# Patient Record
Sex: Male | Born: 1975 | Race: Black or African American | Hispanic: No | Marital: Married | Smoking: Never smoker
Health system: Southern US, Community
[De-identification: ages and names within clinical notes are randomized; demographics above are authoritative.]

## PROBLEM LIST (undated history)

## (undated) DIAGNOSIS — E119 Type 2 diabetes mellitus without complications: Secondary | ICD-10-CM

## (undated) DIAGNOSIS — E785 Hyperlipidemia, unspecified: Secondary | ICD-10-CM

## (undated) HISTORY — DX: Hyperlipidemia, unspecified: E78.5

## (undated) HISTORY — DX: Type 2 diabetes mellitus without complications: E11.9

---

## 2000-04-18 ENCOUNTER — Emergency Department (HOSPITAL_COMMUNITY): Admission: EM | Admit: 2000-04-18 | Discharge: 2000-04-18 | Payer: Self-pay | Admitting: Emergency Medicine

## 2000-04-18 ENCOUNTER — Encounter: Payer: Self-pay | Admitting: Emergency Medicine

## 2001-05-30 ENCOUNTER — Encounter: Payer: Self-pay | Admitting: Emergency Medicine

## 2001-05-30 ENCOUNTER — Emergency Department (HOSPITAL_COMMUNITY): Admission: EM | Admit: 2001-05-30 | Discharge: 2001-05-31 | Payer: Self-pay | Admitting: Emergency Medicine

## 2001-05-31 ENCOUNTER — Encounter: Payer: Self-pay | Admitting: Emergency Medicine

## 2002-11-01 ENCOUNTER — Emergency Department (HOSPITAL_COMMUNITY): Admission: EM | Admit: 2002-11-01 | Discharge: 2002-11-01 | Payer: Self-pay | Admitting: Emergency Medicine

## 2002-11-01 ENCOUNTER — Encounter: Payer: Self-pay | Admitting: Emergency Medicine

## 2003-04-25 ENCOUNTER — Emergency Department (HOSPITAL_COMMUNITY): Admission: EM | Admit: 2003-04-25 | Discharge: 2003-04-25 | Payer: Self-pay

## 2003-04-25 ENCOUNTER — Encounter: Payer: Self-pay | Admitting: Emergency Medicine

## 2003-08-03 ENCOUNTER — Emergency Department (HOSPITAL_COMMUNITY): Admission: EM | Admit: 2003-08-03 | Discharge: 2003-08-03 | Payer: Self-pay | Admitting: Emergency Medicine

## 2003-08-04 ENCOUNTER — Emergency Department (HOSPITAL_COMMUNITY): Admission: AD | Admit: 2003-08-04 | Discharge: 2003-08-04 | Payer: Self-pay | Admitting: Family Medicine

## 2003-08-06 ENCOUNTER — Emergency Department (HOSPITAL_COMMUNITY): Admission: AD | Admit: 2003-08-06 | Discharge: 2003-08-06 | Payer: Self-pay | Admitting: Emergency Medicine

## 2003-08-12 ENCOUNTER — Emergency Department (HOSPITAL_COMMUNITY): Admission: AD | Admit: 2003-08-12 | Discharge: 2003-08-12 | Payer: Self-pay | Admitting: Family Medicine

## 2003-09-17 ENCOUNTER — Emergency Department (HOSPITAL_COMMUNITY): Admission: AD | Admit: 2003-09-17 | Discharge: 2003-09-17 | Payer: Self-pay | Admitting: Family Medicine

## 2003-11-29 ENCOUNTER — Emergency Department (HOSPITAL_COMMUNITY): Admission: EM | Admit: 2003-11-29 | Discharge: 2003-11-29 | Payer: Self-pay | Admitting: Emergency Medicine

## 2004-03-02 ENCOUNTER — Emergency Department (HOSPITAL_COMMUNITY): Admission: EM | Admit: 2004-03-02 | Discharge: 2004-03-02 | Payer: Self-pay | Admitting: Emergency Medicine

## 2004-11-05 ENCOUNTER — Emergency Department (HOSPITAL_COMMUNITY): Admission: EM | Admit: 2004-11-05 | Discharge: 2004-11-05 | Payer: Self-pay | Admitting: Emergency Medicine
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
## 2005-05-20 ENCOUNTER — Emergency Department (HOSPITAL_COMMUNITY): Admission: EM | Admit: 2005-05-20 | Discharge: 2005-05-20 | Payer: Self-pay | Admitting: Emergency Medicine

## 2006-12-25 ENCOUNTER — Emergency Department (HOSPITAL_COMMUNITY): Admission: EM | Admit: 2006-12-25 | Discharge: 2006-12-25 | Payer: Self-pay | Admitting: Emergency Medicine

## 2007-03-02 ENCOUNTER — Emergency Department (HOSPITAL_COMMUNITY): Admission: EM | Admit: 2007-03-02 | Discharge: 2007-03-02 | Payer: Self-pay | Admitting: Family Medicine

## 2012-04-07 ENCOUNTER — Encounter (HOSPITAL_BASED_OUTPATIENT_CLINIC_OR_DEPARTMENT_OTHER): Payer: Self-pay | Admitting: Emergency Medicine

## 2012-04-07 ENCOUNTER — Emergency Department (HOSPITAL_BASED_OUTPATIENT_CLINIC_OR_DEPARTMENT_OTHER)
Admission: EM | Admit: 2012-04-07 | Discharge: 2012-04-07 | Disposition: A | Discharge status: Home / Self Care | Payer: 59 | Attending: Emergency Medicine | Admitting: Emergency Medicine

## 2012-04-07 DIAGNOSIS — J069 Acute upper respiratory infection, unspecified: Secondary | ICD-10-CM | POA: Insufficient documentation

## 2012-04-07 DIAGNOSIS — R040 Epistaxis: Secondary | ICD-10-CM

## 2012-04-07 NOTE — ED Provider Notes (Signed)
History     CSN: [id number]  Arrival date & time 04/07/12  0005   First MD Initiated Contact with Patient 04/07/12 0114      Chief Complaint  Patient presents with  . Nasal Congestion    (Consider location/radiation/quality/duration/timing/severity/associated sxs/prior treatment) Patient is a 36 y.o. male presenting with nosebleeds. The history is provided by the patient. No language interpreter was used.  Epistaxis  This is a new problem. The current episode started 1 to 2 hours ago. The problem occurs constantly. The problem has been resolved. Associated with: none. The bleeding has been from the right nare. He has tried applying pressure for the symptoms. The treatment provided significant relief. His past medical history is significant for colds. His past medical history does not include bleeding disorder, sinus problems, allergies, nose-picking or frequent nosebleeds.  Has had URI and right nostril bled for seconds and wife wanted him check out so he came to the ED.  No f/c/r.  No node swelling no easy bruising or bleeding  History reviewed. No pertinent past medical history.  No past surgical history on file.  No family history on file.  History  Substance Use Topics  . Smoking status: Never Smoker   . Smokeless tobacco: Not on file  . Alcohol Use: No      Review of Systems  Constitutional: Negative for fever.  HENT: Positive for nosebleeds, congestion and rhinorrhea. Negative for neck pain and neck stiffness.   Respiratory: Negative for shortness of breath.   Cardiovascular: Negative for chest pain.  Hematological: Does not bruise/bleed easily.  All other systems reviewed and are negative.    Allergies  Review of patient's allergies indicates no known allergies.  Home Medications  No current outpatient prescriptions on file.  BP 147/94  Pulse 90  Temp 97.9 F (36.6 C) (Oral)  Resp 18  Ht 6' 0.5" (1.842 m)  Wt 205 lb (92.987 kg)  BMI 27.42 kg/m2  SpO2  100%  Physical Exam  Constitutional: He is oriented to person, place, and time. He appears well-developed and well-nourished. No distress.  HENT:  Head: Normocephalic and atraumatic.  Nose: No nasal deformity, septal deviation or nasal septal hematoma. No epistaxis. Right sinus exhibits no maxillary sinus tenderness and no frontal sinus tenderness. Left sinus exhibits no maxillary sinus tenderness and no frontal sinus tenderness.  Mouth/Throat: Oropharynx is clear and moist.       Redness at right kisselbachs plexus no bleeding  Eyes: Conjunctivae are normal. Pupils are equal, round, and reactive to light.  Neck: Normal range of motion. Neck supple.  Cardiovascular: Normal rate and regular rhythm.   Pulmonary/Chest: Effort normal and breath sounds normal. No respiratory distress. He has no wheezes. He has no rales.  Abdominal: Soft. Bowel sounds are normal. There is no tenderness.  Musculoskeletal: Normal range of motion.  Lymphadenopathy:    He has no cervical adenopathy.  Neurological: He is oriented to person, place, and time.  Skin: Skin is warm and dry.  Psychiatric: He has a normal mood and affect.    ED Course  Procedures (including critical care time)  Labs Reviewed - No data to display No results found.   No diagnosis found.    MDM  URI with brief epistaxis saline spray.  Follow up with PMD return for any concerning symptoms.  Patient verbalizes understanding and agrees to follow up        Akasha Melena Smitty Cords, MD 04/07/12 [phone]

## 2012-04-07 NOTE — ED Notes (Signed)
States other membersof family have been sick with cold and now he has been congested several days,but tonight blew his nose and had a nose bleed which stopped quickly

## 2013-08-12 ENCOUNTER — Encounter (HOSPITAL_BASED_OUTPATIENT_CLINIC_OR_DEPARTMENT_OTHER): Payer: Self-pay | Admitting: Emergency Medicine

## 2013-08-12 ENCOUNTER — Emergency Department (HOSPITAL_BASED_OUTPATIENT_CLINIC_OR_DEPARTMENT_OTHER)
Admission: EM | Admit: 2013-08-12 | Discharge: 2013-08-13 | Disposition: A | Discharge status: Home / Self Care | Payer: BC Managed Care – PPO | Attending: Emergency Medicine | Admitting: Emergency Medicine

## 2013-08-12 ENCOUNTER — Emergency Department (HOSPITAL_BASED_OUTPATIENT_CLINIC_OR_DEPARTMENT_OTHER): Payer: BC Managed Care – PPO

## 2013-08-12 DIAGNOSIS — R079 Chest pain, unspecified: Secondary | ICD-10-CM

## 2013-08-12 DIAGNOSIS — L723 Sebaceous cyst: Secondary | ICD-10-CM | POA: Insufficient documentation

## 2013-08-12 DIAGNOSIS — R072 Precordial pain: Secondary | ICD-10-CM | POA: Insufficient documentation

## 2013-08-12 LAB — BASIC METABOLIC PANEL
Calcium: 9.9 mg/dL (ref 8.4–10.5)
Creatinine, Ser: 1 mg/dL (ref 0.50–1.35)
Potassium: 4.1 mEq/L (ref 3.5–5.1)

## 2013-08-12 LAB — CBC WITH DIFFERENTIAL/PLATELET
HCT: 47.9 % (ref 39.0–52.0)
Lymphs Abs: 2.2 10*3/uL (ref 0.7–4.0)
MCH: 28.6 pg (ref 26.0–34.0)
MCV: 85.2 fL (ref 78.0–100.0)
Monocytes Absolute: 0.8 10*3/uL (ref 0.1–1.0)
Monocytes Relative: 10 % (ref 3–12)
Neutrophils Relative %: 62 % (ref 43–77)
Platelets: 270 10*3/uL (ref 150–400)
RBC: 5.62 MIL/uL (ref 4.22–5.81)

## 2013-08-12 LAB — D-DIMER, QUANTITATIVE: D-Dimer, Quant: <0.27 ug/mL-FEU (ref 0.00–0.48)

## 2013-08-12 LAB — TROPONIN I: Troponin I: <0.3 ng/mL (ref <0.30)

## 2013-08-12 MED ORDER — DOXYCYCLINE HYCLATE 100 MG PO CAPS: 100.0000 mg | ORAL_CAPSULE | Freq: Two times a day (BID) | ORAL | Status: DC

## 2013-08-12 MED ORDER — HYDROCODONE-ACETAMINOPHEN 5-325 MG PO TABS: 1.0000 | ORAL_TABLET | Freq: Four times a day (QID) | ORAL | Status: DC | PRN

## 2013-08-12 MED ORDER — ASPIRIN 81 MG PO CHEW
324.0000 mg | CHEWABLE_TABLET | Freq: Once | ORAL | Status: AC
  Administered 2013-08-12: 324 mg via ORAL
  Filled 2013-08-12: qty 4

## 2013-08-12 MED ORDER — NAPROXEN 500 MG PO TABS: 500.0000 mg | ORAL_TABLET | Freq: Two times a day (BID) | ORAL | Status: DC

## 2013-08-12 MED ORDER — HYDROCODONE-ACETAMINOPHEN 5-325 MG PO TABS
1.0000 | ORAL_TABLET | Freq: Once | ORAL | Status: AC
  Administered 2013-08-12: 1 via ORAL
  Filled 2013-08-12: qty 1

## 2013-08-12 NOTE — ED Provider Notes (Signed)
CSN: [id number]     Arrival date & time 08/12/13  1937 History  This chart was scribed for Shelda Jakes, MD by Ronal Fear, ED Scribe. This patient was seen in room MH10/MH10 and the patient's care was started at 9:20 PM.     Chief Complaint  Patient presents with  . Chest Pain   (Consider location/radiation/quality/duration/timing/severity/associated sxs/prior Treatment) Patient is a 37 y.o. male presenting with chest pain. The history is provided by the patient. No language interpreter was used.  Chest Pain Pain location:  Substernal area Pain quality: sharp and throbbing   Pain radiates to:  Does not radiate Pain radiates to the back: no   Pain severity:  Mild Onset quality:  Sudden Duration:  2 days Timing:  Constant Progression:  Unchanged Chronicity:  New Context: not at rest   Relieved by:  None tried Worsened by:  Nothing tried Associated symptoms: cough   Associated symptoms: no abdominal pain, no back pain, no dizziness, no fever, no headache, no lower extremity edema, no nausea, no shortness of breath and not vomiting     HPI Comments: James Hudson is a 37 y.o. male who presents to the Emergency Department complaining of sudden onset constant sharp throbbing 8/10 substernal chest pain that does not radiate to his back. Pt has not experienced any prior occurences. He states that nothing makes the pain better or worse. Pt also complains of a cyst in his right axilla. Pt denies SOB.   History reviewed. No pertinent past medical history. History reviewed. No pertinent past surgical history. No family history on file. History  Substance Use Topics  . Smoking status: Never Smoker   . Smokeless tobacco: Not on file  . Alcohol Use: No    Review of Systems  Constitutional: Negative for fever and chills.  HENT: Negative for congestion, rhinorrhea and sore throat.   Eyes: Negative for visual disturbance.  Respiratory: Positive for cough. Negative for shortness  of breath.   Cardiovascular: Positive for chest pain. Negative for leg swelling.  Gastrointestinal: Negative for nausea, vomiting, abdominal pain and diarrhea.  Genitourinary: Negative for dysuria.  Musculoskeletal: Negative for back pain, joint swelling and neck pain.  Skin: Negative for rash.  Neurological: Negative for dizziness and headaches.  Hematological: Does not bruise/bleed easily.  All other systems reviewed and are negative.    Allergies  Review of patient's allergies indicates no known allergies.  Home Medications   Current Outpatient Rx  Name  Route  Sig  Dispense  Refill  . doxycycline (VIBRAMYCIN) 100 MG capsule   Oral   Take 1 capsule (100 mg total) by mouth 2 (two) times daily.   14 capsule   0   . HYDROcodone-acetaminophen (NORCO/VICODIN) 5-325 MG per tablet   Oral   Take 1-2 tablets by mouth every 6 (six) hours as needed for moderate pain.   14 tablet   0   . naproxen (NAPROSYN) 500 MG tablet   Oral   Take 1 tablet (500 mg total) by mouth 2 (two) times daily.   14 tablet   0    BP 122/76  Pulse 110  Temp(Src) 98.6 F (37 C) (Oral)  Resp 18  Ht 6\' 1"  (1.854 m)  Wt 265 lb (120.203 kg)  BMI 34.97 kg/m2  SpO2 98% Physical Exam  Nursing note and vitals reviewed. Constitutional: He is oriented to person, place, and time. He appears well-developed and well-nourished.  HENT:  Head: Normocephalic and atraumatic.  Mouth/Throat: No  oropharyngeal exudate.  Eyes: Conjunctivae are normal. Pupils are equal, round, and reactive to light.  Neck: Neck supple.  Cardiovascular: Normal rate, regular rhythm and normal heart sounds.   No murmur heard. Pulmonary/Chest: Effort normal and breath sounds normal. No respiratory distress. He has no wheezes.  Abdominal: Soft. Bowel sounds are normal. There is no tenderness.  Musculoskeletal: He exhibits no edema.  Left axilla 3cm area of induration, tenderness, minimal redness, no fluctuance, no adenopathy   Lymphadenopathy:    He has no cervical adenopathy.  Neurological: He is alert and oriented to person, place, and time. No cranial nerve deficit. He exhibits normal muscle tone. Coordination normal.  Skin: Skin is warm and dry.  Psychiatric: He has a normal mood and affect.    ED Course  Procedures (including critical care time)  9:28 PM- Pt advised of plan for treatment including pain medication and chest x-ray and pt agrees.  Labs Review Labs Reviewed  BASIC METABOLIC PANEL - Abnormal; Notable for the following:    Glucose, Bld 112 (*)    All other components within normal limits  CBC WITH DIFFERENTIAL  D-DIMER, QUANTITATIVE  TROPONIN I   Imaging Review Dg Chest 2 View  08/12/2013   CLINICAL DATA:  37 year old male with right chest pain  EXAM: CHEST  2 VIEW  COMPARISON:  11/05/2004 chest radiograph.  FINDINGS: The cardiomediastinal silhouette is unremarkable.  There is no evidence of focal airspace disease, pulmonary edema, suspicious pulmonary nodule/mass, pleural effusion, or pneumothorax. No acute bony abnormalities are identified.  IMPRESSION: No active cardiopulmonary disease.   Electronically Signed   By: Laveda Abbe M.D.   On: 08/12/2013 21:55    EKG Interpretation     Ventricular Rate:  107 PR Interval:  168 QRS Duration: 88 QT Interval:  326 QTC Calculation: 435 R Axis:   -43 Text Interpretation:  Sinus tachycardia Possible Left atrial enlargement Left axis deviation Anterior infarct , age undetermined Abnormal ECG           Results for orders placed during the hospital encounter of 08/12/13  CBC WITH DIFFERENTIAL      Result Value Range   WBC 8.0  4.0 - 10.5 K/uL   RBC 5.62  4.22 - 5.81 MIL/uL   Hemoglobin 16.1  13.0 - 17.0 g/dL   HCT 16.1  09.6 - 04.5 %   MCV 85.2  78.0 - 100.0 fL   MCH 28.6  26.0 - 34.0 pg   MCHC 33.6  30.0 - 36.0 g/dL   RDW 40.9  81.1 - 91.4 %   Platelets 270  150 - 400 K/uL   Neutrophils Relative % 62  43 - 77 %   Neutro Abs 5.0   1.7 - 7.7 K/uL   Lymphocytes Relative 27  12 - 46 %   Lymphs Abs 2.2  0.7 - 4.0 K/uL   Monocytes Relative 10  3 - 12 %   Monocytes Absolute 0.8  0.1 - 1.0 K/uL   Eosinophils Relative 1  0 - 5 %   Eosinophils Absolute 0.1  0.0 - 0.7 K/uL   Basophils Relative 0  0 - 1 %   Basophils Absolute 0.0  0.0 - 0.1 K/uL  BASIC METABOLIC PANEL      Result Value Range   Sodium 138  135 - 145 mEq/L   Potassium 4.1  3.5 - 5.1 mEq/L   Chloride 98  96 - 112 mEq/L   CO2 29  19 - 32 mEq/L  Glucose, Bld 112 (*) 70 - 99 mg/dL   BUN 14  6 - 23 mg/dL   Creatinine, Ser 1.61  0.50 - 1.35 mg/dL   Calcium 9.9  8.4 - 09.6 mg/dL   GFR calc non Af Amer >90  >90 mL/min   GFR calc Af Amer >90  >90 mL/min  D-DIMER, QUANTITATIVE      Result Value Range   D-Dimer, Quant <0.27  0.00 - 0.48 ug/mL-FEU  TROPONIN I      Result Value Range   Troponin I <0.30  <0.30 ng/mL     MDM   1. Chest pain   2. Sebaceous cyst of right axilla    Patient's chest pain workup without any sniffing findings. D-dimer negative not consistent with pulmonary embolism. Patient's troponin is negative after 2 days of chest pain. Chest pains been constant. Electrolytes without sniffing abnormalities no renal dysfunction. No leukocytosis no anemia. EKG had no acute changes. Chest x-rays negative for pneumonia pneumothorax or pulmonary edema. Etiology of chest pain seems to be noncardiac.  Currently patient's son nontoxic no acute distress.  In addition patient had the soreness and tenderness to the right axillary area with a 3 cm area of induration no fluctuance developing abscess persist. Will treat with doxycycline not ready for incision and drainage at this time. Patient will watch this carefully if he gets worse it may develop into an abscess that needs to be I&D.  I personally performed the services described in this documentation, which was scribed in my presence. The recorded information has been reviewed and is  accurate.     Shelda Jakes, MD 08/12/13 [phone]

## 2013-08-12 NOTE — ED Notes (Signed)
Patient transported to X-ray 

## 2013-08-12 NOTE — ED Notes (Signed)
Sharp central CP x 2 days-"knot came up under my arm" x 2 days

## 2013-08-12 NOTE — ED Notes (Signed)
Chest pain x 2 days, mid sternal,  No n/v,    States has knot under rt arm that started at same time chest pain

## 2013-08-12 NOTE — ED Notes (Signed)
MD at bedside giving verbal discharge instructions.

## 2013-08-12 NOTE — ED Notes (Signed)
EKG was reviewed by EDP Zacowski-no orders received

## 2013-08-20 ENCOUNTER — Ambulatory Visit: Payer: Self-pay | Admitting: Podiatrist

## 2013-09-10 ENCOUNTER — Ambulatory Visit: Payer: Self-pay | Admitting: Podiatrist

## 2013-10-16 ENCOUNTER — Encounter (HOSPITAL_BASED_OUTPATIENT_CLINIC_OR_DEPARTMENT_OTHER): Payer: Self-pay | Admitting: Emergency Medicine

## 2013-10-16 ENCOUNTER — Emergency Department (HOSPITAL_BASED_OUTPATIENT_CLINIC_OR_DEPARTMENT_OTHER)
Admission: EM | Admit: 2013-10-16 | Discharge: 2013-10-16 | Disposition: A | Discharge status: Home / Self Care | Payer: BC Managed Care – PPO | Attending: Emergency Medicine | Admitting: Emergency Medicine

## 2013-10-16 DIAGNOSIS — J069 Acute upper respiratory infection, unspecified: Secondary | ICD-10-CM | POA: Insufficient documentation

## 2013-10-16 MED ORDER — PROMETHAZINE-DM 6.25-15 MG/5ML PO SYRP: 2.5000 mL | ORAL_SOLUTION | Freq: Four times a day (QID) | ORAL | Status: DC | PRN

## 2013-10-16 NOTE — Discharge Instructions (Signed)
As discussed, you have been diagnosed with an acute upper respiratory infection.  Her vital signs, and physical exam are reassuring.  However, it is important that you monitor your condition carefully, and do not hesitate to return here if you develop new, or concerning changes in your condition.

## 2013-10-16 NOTE — ED Notes (Signed)
Patient here with bodyaches, cough and congestion x 4 days. No distress. Cough worse at night

## 2013-10-16 NOTE — ED Provider Notes (Signed)
CSN: [id number]     Arrival date & time 10/16/13  1054 History   First MD Initiated Contact with Patient 10/16/13 1133     Chief Complaint  Patient presents with  . Nasal Congestion  . Cough  . Generalized Body Aches   (Consider location/radiation/quality/duration/timing/severity/associated sxs/prior Treatment) HPI Patient presents with concerns of cough, congestion.  Symptoms began 4 days ago.  Since onset symptoms have been persistent.  There is mild relief with Nyquil, Mucinex. Patient describes warm and cold sensation, has no objective fever. Patient denies dyspnea, headache, and falling, confusion, dyspnea, vomiting, nausea, vomiting, diarrhea. Patient is a positive sick contacts, including his wife. No flu shot this year.  History reviewed. No pertinent past medical history. History reviewed. No pertinent past surgical history. No family history on file. History  Substance Use Topics  . Smoking status: Never Smoker   . Smokeless tobacco: Not on file  . Alcohol Use: No    Review of Systems  Constitutional:       Per HPI, otherwise negative  HENT:       Per HPI, otherwise negative  Respiratory:       Per HPI, otherwise negative  Cardiovascular:       Per HPI, otherwise negative  Gastrointestinal: Negative for vomiting.  Endocrine:       Negative aside from HPI  Genitourinary:       Neg aside from HPI   Musculoskeletal:       Per HPI, otherwise negative  Skin: Negative.   Neurological: Negative for syncope.    Allergies  Review of patient's allergies indicates no known allergies.  Home Medications   Current Outpatient Rx  Name  Route  Sig  Dispense  Refill  . promethazine-dextromethorphan (PROMETHAZINE-DM) 6.25-15 MG/5ML syrup   Oral   Take 2.5 mLs by mouth 4 (four) times daily as needed for cough.   118 mL   0    BP 134/84  Pulse 94  Temp(Src) 98.4 F (36.9 C) (Oral)  Resp 18  Ht 6\' 1"  (1.854 m)  Wt 275 lb (124.739 kg)  BMI 36.29 kg/m2  SpO2  100% Physical Exam  Nursing note and vitals reviewed. Constitutional: He is oriented to person, place, and time. He appears well-developed. No distress.  HENT:  Head: Normocephalic and atraumatic.  Eyes: Conjunctivae and EOM are normal.  Cardiovascular: Normal rate and regular rhythm.   Pulmonary/Chest: Effort normal. No stridor. No respiratory distress.  Abdominal: He exhibits no distension.  Musculoskeletal: He exhibits no edema.  Neurological: He is alert and oriented to person, place, and time.  Skin: Skin is warm and dry.  Psychiatric: He has a normal mood and affect.    ED Course  Procedures (including critical care time) Labs Review Labs Reviewed - No data to display Imaging Review No results found.  EKG Interpretation   None       MDM   1. URI, acute    This generally well male presents with cough, congestion.  On exam he is awake alert and afebrile.  Patient does not smoke, has low risk profile for occult infection.  Patient started on new symptomatic management medication, discharged in stable condition.    Gerhard Munchobert Effa Yarrow, MD 10/16/13 1159

## 2013-10-18 ENCOUNTER — Encounter (HOSPITAL_COMMUNITY): Payer: Self-pay | Admitting: Emergency Medicine

## 2013-10-18 ENCOUNTER — Emergency Department (HOSPITAL_COMMUNITY)
Admission: EM | Admit: 2013-10-18 | Discharge: 2013-10-19 | Disposition: A | Discharge status: Home / Self Care | Payer: BC Managed Care – PPO | Attending: Emergency Medicine | Admitting: Emergency Medicine

## 2013-10-18 DIAGNOSIS — H109 Unspecified conjunctivitis: Secondary | ICD-10-CM | POA: Insufficient documentation

## 2013-10-18 DIAGNOSIS — R040 Epistaxis: Secondary | ICD-10-CM | POA: Insufficient documentation

## 2013-10-18 DIAGNOSIS — R599 Enlarged lymph nodes, unspecified: Secondary | ICD-10-CM | POA: Insufficient documentation

## 2013-10-18 DIAGNOSIS — B9789 Other viral agents as the cause of diseases classified elsewhere: Secondary | ICD-10-CM

## 2013-10-18 DIAGNOSIS — J069 Acute upper respiratory infection, unspecified: Secondary | ICD-10-CM | POA: Insufficient documentation

## 2013-10-18 MED ORDER — SALINE SPRAY 0.65 % NA SOLN
1.0000 | Freq: Once | NASAL | Status: AC
  Administered 2013-10-19: 1 via NASAL
  Filled 2013-10-18: qty 44

## 2013-10-18 MED ORDER — ARTIFICIAL TEARS OP OINT
TOPICAL_OINTMENT | Freq: Once | OPHTHALMIC | Status: AC
  Administered 2013-10-19: via OPHTHALMIC
  Filled 2013-10-18: qty 3.5

## 2013-10-18 MED ORDER — IBUPROFEN 200 MG PO TABS
600.0000 mg | ORAL_TABLET | Freq: Once | ORAL | Status: AC
  Administered 2013-10-18: 600 mg via ORAL
  Filled 2013-10-18: qty 3

## 2013-10-18 NOTE — ED Notes (Signed)
Patient was states he was seen [facility] and treated for URI. Patient states they gave him cough medication which he started having irritation in his eyes. Patient states he feels a lot of pressure in his eye and it feels like something is in his eyes.

## 2013-10-18 NOTE — ED Notes (Signed)
Pt from home vis [facility] c/o of a nosebleed x 3 days along with body aches, cough and congestion. He is also c/o redness, itching to the eyes and states "feels like there is grit's in my eye". Eyes do appear red and watery.

## 2013-10-19 MED ORDER — IBUPROFEN 600 MG PO TABS: 600.0000 mg | ORAL_TABLET | Freq: Four times a day (QID) | ORAL | Status: DC | PRN

## 2013-10-19 MED ORDER — ERYTHROMYCIN 5 MG/GM OP OINT: TOPICAL_OINTMENT | OPHTHALMIC | Status: DC

## 2013-10-19 MED ORDER — OXYMETAZOLINE HCL 0.05 % NA SOLN: 1.0000 | Freq: Two times a day (BID) | NASAL | Status: DC

## 2013-10-19 NOTE — ED Provider Notes (Signed)
CSN: [id number]     Arrival date & time 10/18/13  2050 History   First MD Initiated Contact with Patient 10/18/13 2313     Chief Complaint  Patient presents with  . Epistaxis   (Consider location/radiation/quality/duration/timing/severity/associated sxs/prior Treatment) HPI Comments: Pt comes in with cc of nose bleed. No medical hx. 4 days of nasal congestion, sore throat, red eyes and tearing. Pt also has been having epistaxis x 3 days, intermittent. No cocaine use. No trauma. No n/v/f/c.   Patient is a 38 y.o. male presenting with nosebleeds. The history is provided by the patient.  Epistaxis Associated symptoms: cough   Associated symptoms: no fever     History reviewed. No pertinent past medical history. History reviewed. No pertinent past surgical history. History reviewed. No pertinent family history. History  Substance Use Topics  . Smoking status: Never Smoker   . Smokeless tobacco: Not on file  . Alcohol Use: No    Review of Systems  Constitutional: Positive for chills and fatigue. Negative for fever.  HENT: Positive for nosebleeds.   Respiratory: Positive for cough. Negative for shortness of breath.   Cardiovascular: Negative for chest pain.  Gastrointestinal: Negative for nausea and vomiting.  Skin: Negative for rash.  Hematological: Does not bruise/bleed easily.  Psychiatric/Behavioral: Negative for confusion.    Allergies  Review of patient's allergies indicates no known allergies.  Home Medications   Current Outpatient Rx  Name  Route  Sig  Dispense  Refill  . acetaminophen (TYLENOL) 325 MG tablet   Oral   Take 650 mg by mouth every 6 (six) hours as needed (pain).         Marland Kitchen guaiFENesin (MUCINEX) 600 MG 12 hr tablet   Oral   Take 1,200 mg by mouth 2 (two) times daily as needed (cough).         . promethazine-dextromethorphan (PROMETHAZINE-DM) 6.25-15 MG/5ML syrup   Oral   Take 2.5 mLs by mouth 4 (four) times daily as needed for cough.   118  mL   0   . erythromycin ophthalmic ointment      Place a 1/2 inch ribbon of ointment into the lower eyelid.   3.5 g   0   . ibuprofen (ADVIL,MOTRIN) 600 MG tablet   Oral   Take 1 tablet (600 mg total) by mouth every 6 (six) hours as needed.   30 tablet   0   . oxymetazoline (AFRIN NASAL SPRAY) 0.05 % nasal spray   Each Nare   Place 1 spray into both nostrils 2 (two) times daily.   30 mL   0    BP 130/84  Pulse 93  Temp(Src) 99 F (37.2 C) (Oral)  Resp 22  Ht 6\' 1"  (1.854 m)  Wt 275 lb (124.739 kg)  BMI 36.29 kg/m2  SpO2 99% Physical Exam  Nursing note and vitals reviewed. Constitutional: He is oriented to person, place, and time. He appears well-developed.  HENT:  Head: Normocephalic and atraumatic.  Eyes: Conjunctivae and EOM are normal. Pupils are equal, round, and reactive to light. No scleral icterus.  Bilateral eyes have injected sclerae, no purulence.  Neck: Normal range of motion. Neck supple.  Posterior pharynx erythema  Cardiovascular: Normal rate and regular rhythm.   Pulmonary/Chest: Effort normal and breath sounds normal. No respiratory distress. He has no wheezes. He has no rales. He exhibits no tenderness.  Abdominal: Soft. Bowel sounds are normal. He exhibits no distension. There is no tenderness. There is no rebound  and no guarding.  Lymphadenopathy:    He has cervical adenopathy.  Neurological: He is alert and oriented to person, place, and time.  Skin: Skin is warm.    ED Course  Procedures (including critical care time) Labs Review Labs Reviewed - No data to display Imaging Review No results found.  EKG Interpretation   None       MDM   1. Viral URI with cough   2. Conjunctivitis   3. Epistaxis    Pt comes in with URI like sx and epistaxis.  Pt's vitals are stable, and sx started 4 days ago. No clear evidence of nose bleed source on exam - some dry blood seen - but there is no active bleeding, or a scab. Pt will get epistaxis  meds. Artifical tears and topical eye tx IF NOT BETTER BY Thursday.  Derwood KaplanAnkit Yolonda Purtle, MD 10/19/13 [phone]

## 2013-10-19 NOTE — Discharge Instructions (Signed)
We think what you have is a viral syndrome - the treatment for which is symptomatic relief only, and your body will fight the infection off in a few days. We are prescribing you some meds for pain and fevers and eye complains and nos bleeding. See your primary care doctor in 1 week if the symptoms dont improve.   Upper Respiratory Infection, Adult An upper respiratory infection (URI) is also known as the common cold. It is often caused by a type of germ (virus). Colds are easily spread (contagious). You can pass it to others by kissing, coughing, sneezing, or drinking out of the same glass. Usually, you get better in 1 or 2 weeks.  HOME CARE   Only take medicine as told by your doctor.  Use a warm mist humidifier or breathe in steam from a hot shower.  Drink enough water and fluids to keep your pee (urine) clear or pale yellow.  Get plenty of rest.  Return to work when your temperature is back to normal or as told by your doctor. You may use a face mask and wash your hands to stop your cold from spreading. GET HELP RIGHT AWAY IF:   After the first few days, you feel you are getting worse.  You have questions about your medicine.  You have chills, shortness of breath, or brown or red spit (mucus).  You have yellow or brown snot (nasal discharge) or pain in the face, especially when you bend forward.  You have a fever, puffy (swollen) neck, pain when you swallow, or white spots in the back of your throat.  You have a bad headache, ear pain, sinus pain, or chest pain.  You have a high-pitched whistling sound when you breathe in and out (wheezing).  You have a lasting cough or cough up blood.  You have sore muscles or a stiff neck. MAKE SURE YOU:   Understand these instructions.  Will watch your condition.  Will get help right away if you are not doing well or get worse. Document Released: 03/04/2008 Document Revised: 12/09/2011 Document Reviewed: 01/21/2011 [facility] Patient  Information 2014 [place], [place].  Viral Infections A virus is a type of germ. Viruses can cause:  Minor sore throats.  Aches and pains.  Headaches.  Runny nose.  Rashes.  Watery eyes.  Tiredness.  Coughs.  Loss of appetite.  Feeling sick to your stomach (nausea).  Throwing up (vomiting).  Watery poop (diarrhea). HOME CARE   Only take medicines as told by your doctor.  Drink enough water and fluids to keep your pee (urine) clear or pale yellow. Sports drinks are a good choice.  Get plenty of rest and eat healthy. Soups and broths with crackers or rice are fine. GET HELP RIGHT AWAY IF:   You have a very bad headache.  You have shortness of breath.  You have chest pain or neck pain.  You have an unusual rash.  You cannot stop throwing up.  You have watery poop that does not stop.  You cannot keep fluids down.  You or your child has a temperature by mouth above 102 F (38.9 C), not controlled by medicine.  Your baby is older than 3 months with a rectal temperature of 102 F (38.9 C) or higher.  Your baby is 32 months old or younger with a rectal temperature of 100.4 F (38 C) or higher. MAKE SURE YOU:   Understand these instructions.  Will watch this condition.  Will get help right  away if you are not doing well or get worse. Document Released: 08/29/2008 Document Revised: 12/09/2011 Document Reviewed: 01/22/2011 [facility] Patient Information 2014 [place].  Nosebleed Nosebleeds can be caused by many conditions including trauma, infections, polyps, foreign bodies, dry mucous membranes or climate, medications and air conditioning. Most nosebleeds occur in the front of the nose. It is because of this location that most nosebleeds can be controlled by pinching the nostrils gently and continuously. Do this for at least 10 to 20 minutes. The reason for this long continuous pressure is that you must hold it long enough for the blood to clot. If during  that 10 to 20 minute time period, pressure is released, the process may have to be started again. The nosebleed may stop by itself, quit with pressure, need concentrated heating (cautery) or stop with pressure from packing. HOME CARE INSTRUCTIONS   If your nose was packed, try to maintain the pack inside until your caregiver removes it. If a gauze pack was used and it starts to fall out, gently replace or cut the end off. Do not cut if a balloon catheter was used to pack the nose. Otherwise, do not remove unless instructed.  Avoid blowing your nose for 12 hours after treatment. This could dislodge the pack or clot and start bleeding again.  If the bleeding starts again, sit up and bending forward, gently pinch the front half of your nose continuously for 20 minutes.  If bleeding was caused by dry mucous membranes, cover the inside of your nose every morning with a petroleum or antibiotic ointment. Use your little fingertip as an applicator. Do this as needed during dry weather. This will keep the mucous membranes moist and allow them to heal.  Maintain humidity in your home by using less air conditioning or using a humidifier.  Do not use aspirin or medications which make bleeding more likely. Your caregiver can give you recommendations on this.  Resume normal activities as able but try to avoid straining, lifting or bending at the waist for several days.  If the nosebleeds become recurrent and the cause is unknown, your caregiver may suggest laboratory tests. SEEK IMMEDIATE MEDICAL CARE IF:   Bleeding recurs and cannot be controlled.  There is unusual bleeding from or bruising on other parts of the body.  You have a fever.  Nosebleeds continue.  There is any worsening of the condition which originally brought you in.  You become lightheaded, feel faint, become sweaty or vomit blood. MAKE SURE YOU:   Understand these instructions.  Will watch your condition.  Will get help right  away if you are not doing well or get worse. Document Released: 06/26/2005 Document Revised: 12/09/2011 Document Reviewed: 08/18/2009 [facility] Patient Information 2014 [place], [place].  Conjunctivitis Conjunctivitis is commonly called "pink eye." Conjunctivitis can be caused by bacterial or viral infection, allergies, or injuries. There is usually redness of the lining of the eye, itching, discomfort, and sometimes discharge. There may be deposits of matter along the eyelids. A viral infection usually causes a watery discharge, while a bacterial infection causes a yellowish, thick discharge. Pink eye is very contagious and spreads by direct contact. You may be given antibiotic eyedrops as part of your treatment. Before using your eye medicine, remove all drainage from the eye by washing gently with warm water and cotton balls. Continue to use the medication until you have awakened 2 mornings in a row without discharge from the eye. Do not rub your eye. This  increases the irritation and helps spread infection. Use separate towels from other household members. Wash your hands with soap and water before and after touching your eyes. Use cold compresses to reduce pain and sunglasses to relieve irritation from light. Do not wear contact lenses or wear eye makeup until the infection is gone. SEEK MEDICAL CARE IF:   Your symptoms are not better after 3 days of treatment.  You have increased pain or trouble seeing.  The outer eyelids become very red or swollen. Document Released: 10/24/2004 Document Revised: 12/09/2011 Document Reviewed: 09/16/2005 [facility] Patient Information 2014 [place], [place].

## 2014-04-29 ENCOUNTER — Ambulatory Visit: Payer: Self-pay | Admitting: Podiatrist

## 2014-05-13 ENCOUNTER — Ambulatory Visit: Payer: Self-pay | Admitting: Podiatrist

## 2014-06-03 ENCOUNTER — Ambulatory Visit: Payer: Self-pay | Admitting: Podiatrist

## 2014-12-26 ENCOUNTER — Ambulatory Visit (INDEPENDENT_AMBULATORY_CARE_PROVIDER_SITE_OTHER): Payer: Managed Care, Other (non HMO) | Admitting: Podiatry

## 2014-12-26 VITALS — BP 138/83 | HR 85 | Resp 16 | Ht 73.0 in | Wt 275.0 lb

## 2014-12-26 DIAGNOSIS — B351 Tinea unguium: Secondary | ICD-10-CM | POA: Diagnosis not present

## 2014-12-26 MED ORDER — TERBINAFINE HCL 250 MG PO TABS: 250.0000 mg | ORAL_TABLET | Freq: Every day | ORAL | Status: DC

## 2014-12-27 NOTE — Progress Notes (Signed)
Subjective:     Patient ID: James Hudson, male   DOB: 10/10/1975, 39 y.o.   MRN: [id number]  HPI patient presents stating my nails are thick and still giving me problems   Review of Systems     Objective:   Physical Exam Neurovascular status intact with yellow discolored nailbeds that are thick yellow and brittle    Assessment:     Chronic nail infection that he had had treated previously but under treated with some results and he would like to pursue continued conservative treatment    Plan:

## 2015-05-12 ENCOUNTER — Ambulatory Visit: Payer: Managed Care, Other (non HMO) | Admitting: Podiatry

## 2015-05-19 ENCOUNTER — Encounter: Payer: Self-pay | Admitting: Podiatry

## 2015-05-19 ENCOUNTER — Ambulatory Visit (INDEPENDENT_AMBULATORY_CARE_PROVIDER_SITE_OTHER): Payer: Managed Care, Other (non HMO) | Admitting: Podiatry

## 2015-05-19 VITALS — BP 128/85 | HR 96 | Resp 16

## 2015-05-19 DIAGNOSIS — B351 Tinea unguium: Secondary | ICD-10-CM

## 2015-05-19 MED ORDER — TERBINAFINE HCL 250 MG PO TABS: ORAL_TABLET | ORAL | Status: DC

## 2015-05-21 NOTE — Progress Notes (Signed)
Subjective:     Patient ID: James Hudson, male   DOB: May 29, 1976, 39 y.o.   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

## 2015-07-14 ENCOUNTER — Encounter (HOSPITAL_BASED_OUTPATIENT_CLINIC_OR_DEPARTMENT_OTHER): Payer: Self-pay

## 2015-07-14 ENCOUNTER — Emergency Department (HOSPITAL_BASED_OUTPATIENT_CLINIC_OR_DEPARTMENT_OTHER)
Admission: EM | Admit: 2015-07-14 | Discharge: 2015-07-14 | Disposition: A | Discharge status: Home / Self Care | Payer: Managed Care, Other (non HMO) | Attending: Emergency Medicine | Admitting: Emergency Medicine

## 2015-07-14 DIAGNOSIS — E119 Type 2 diabetes mellitus without complications: Secondary | ICD-10-CM

## 2015-07-14 DIAGNOSIS — R10A0 Flank pain, unspecified side: Secondary | ICD-10-CM

## 2015-07-14 DIAGNOSIS — R109 Unspecified abdominal pain: Secondary | ICD-10-CM | POA: Diagnosis not present

## 2015-07-14 DIAGNOSIS — R350 Frequency of micturition: Secondary | ICD-10-CM | POA: Insufficient documentation

## 2015-07-14 LAB — CBG MONITORING, ED
GLUCOSE-CAPILLARY: 310 mg/dL — AB (ref 65–99)
GLUCOSE-CAPILLARY: 210 mg/dL — AB (ref 65–99)

## 2015-07-14 LAB — CBC
HEMATOCRIT: 43.2 % (ref 39.0–52.0)
Hemoglobin: 14.5 g/dL (ref 13.0–17.0)
MCH: 28.1 pg (ref 26.0–34.0)
MCHC: 33.6 g/dL (ref 30.0–36.0)
MCV: 83.7 fL (ref 78.0–100.0)
PLATELETS: 230 10*3/uL (ref 150–400)
RBC: 5.16 MIL/uL (ref 4.22–5.81)
RDW: 13.4 % (ref 11.5–15.5)
WBC: 4.9 10*3/uL (ref 4.0–10.5)

## 2015-07-14 LAB — COMPREHENSIVE METABOLIC PANEL
ALBUMIN: 4.6 g/dL (ref 3.5–5.0)
ALT: 28 U/L (ref 17–63)
AST: 17 U/L (ref 15–41)
Alkaline Phosphatase: 79 U/L (ref 38–126)
Anion gap: 8 (ref 5–15)
BUN: 13 mg/dL (ref 6–20)
CHLORIDE: 101 mmol/L (ref 101–111)
CO2: 27 mmol/L (ref 22–32)
CREATININE: 0.83 mg/dL (ref 0.61–1.24)
Calcium: 9.3 mg/dL (ref 8.9–10.3)
GFR calc Af Amer: >60 mL/min (ref >60)
GLUCOSE: 335 mg/dL — AB (ref 65–99)
POTASSIUM: 4.2 mmol/L (ref 3.5–5.1)
Sodium: 136 mmol/L (ref 135–145)
Total Bilirubin: 0.5 mg/dL (ref 0.3–1.2)
Total Protein: 7.6 g/dL (ref 6.5–8.1)

## 2015-07-14 LAB — URINALYSIS, ROUTINE W REFLEX MICROSCOPIC
Bilirubin Urine: NEGATIVE
Hgb urine dipstick: NEGATIVE
KETONES UR: NEGATIVE mg/dL
LEUKOCYTES UA: NEGATIVE
NITRITE: NEGATIVE
Protein, ur: NEGATIVE mg/dL
Specific Gravity, Urine: 1.039 — ABNORMAL HIGH (ref 1.005–1.030)
Urobilinogen, UA: 0.2 mg/dL (ref 0.0–1.0)
pH: 5.5 (ref 5.0–8.0)

## 2015-07-14 LAB — URINE MICROSCOPIC-ADD ON

## 2015-07-14 MED ORDER — SODIUM CHLORIDE 0.9 % IV BOLUS (SEPSIS)
1000.0000 mL | Freq: Once | INTRAVENOUS | Status: AC
  Administered 2015-07-14: 1000 mL via INTRAVENOUS

## 2015-07-14 NOTE — ED Notes (Signed)
Right flank and abd pain, increased urination x "months"-worse today

## 2015-07-14 NOTE — ED Notes (Signed)
Called pharm here to set up meter and strips for pt , pt given [place] MD upstairs appt made for Monday. New dm booklet/handout info givne to pt

## 2015-07-14 NOTE — ED Provider Notes (Signed)
CSN: [id number]     Arrival date & time 07/14/15  1305 History   First MD Initiated Contact with Patient 07/14/15 1324     Chief Complaint  Patient presents with  . Flank Pain     (Consider location/radiation/quality/duration/timing/severity/associated sxs/prior Treatment) HPI Comments: Patient is a 39 year old male who presents to the emergency department with a complaint of right flank and abdomen pain.  The patient states that he has been noticing increased urination for months. Over the last few days he has been noticing right flank and abdomen area pain. He denies any fever, chills, or any blood in the urine. He has not had any injury to the abdomen or kidney area. There's been no recent operations or procedures involving the abdomen or kidney area. It is of note that the patient states that when he gets his physicals at the local urgent care, that they tell him that his sugars have been around 200. He has been encouraged to see a primary physician, but did not up to this point. He denies any nausea, or vomiting. He presents now for evaluation of the increased urination, as well as the pain. He states that he does not have immediate family members who have history of diabetes. He has not had any family members that have had renal issues.  The history is provided by the patient.    History reviewed. No pertinent past medical history. History reviewed. No pertinent past surgical history. No family history on file. Social History  Substance Use Topics  . Smoking status: Never Smoker   . Smokeless tobacco: None  . Alcohol Use: No    Review of Systems  Gastrointestinal: Positive for abdominal pain.  Genitourinary: Positive for frequency and flank pain. Negative for discharge and penile pain.  All other systems reviewed and are negative.     Allergies  Review of patient's allergies indicates no known allergies.  Home Medications   Prior to Admission medications   Not on File    BP 137/97 mmHg  Pulse 94  Temp(Src) 97.5 F (36.4 C) (Oral)  Resp 18  Ht 6\' 1"  (1.854 m)  Wt 265 lb (120.203 kg)  BMI 34.97 kg/m2  SpO2 100% Physical Exam  Constitutional: He is oriented to person, place, and time. He appears well-developed and well-nourished.  Non-toxic appearance.  HENT:  Head: Normocephalic.  Right Ear: Tympanic membrane and external ear normal.  Left Ear: Tympanic membrane and external ear normal.  Eyes: EOM and lids are normal. Pupils are equal, round, and reactive to light.  Neck: Normal range of motion. Neck supple. Carotid bruit is not present.  Cardiovascular: Normal rate, regular rhythm, normal heart sounds, intact distal pulses and normal pulses.   Pulmonary/Chest: Breath sounds normal. No respiratory distress.  Abdominal: Soft. Bowel sounds are normal. He exhibits no distension and no mass. There is no tenderness. There is no rebound and no guarding.  Musculoskeletal: Normal range of motion. He exhibits no edema or tenderness.  Lymphadenopathy:       Head (right side): No submandibular adenopathy present.       Head (left side): No submandibular adenopathy present.    He has no cervical adenopathy.  Neurological: He is alert and oriented to person, place, and time. He has normal strength. No cranial nerve deficit or sensory deficit.  Skin: Skin is warm and dry.  Psychiatric: He has a normal mood and affect. His speech is normal.  Nursing note and vitals reviewed.   ED Course  Procedures (including critical care time) Labs Review Labs Reviewed  URINALYSIS, ROUTINE W REFLEX MICROSCOPIC (NOT AT [facility]) - Abnormal; Notable for the following:    Specific Gravity, Urine 1.039 (*)    Glucose, UA >1000 (*)    All other components within normal limits  CBG MONITORING, ED - Abnormal; Notable for the following:    Glucose-Capillary 310 (*)    All other components within normal limits  URINE MICROSCOPIC-ADD ON    Imaging Review No results found. I  have personally reviewed and evaluated these images and lab results as part of my medical decision-making.   EKG Interpretation None      MDM  Vital signs reviewed. Urinalysis reveals a clear yellow specimen with a specific gravity of 1.039, and a glucose elevated at greater than 1000 mg/daL. The remainder of the urine test is negative. Capillary blood glucose reveals a glucose to be elevated at 310. Blood is been obtained to send to the lab. IV fluids have also been started.  Patient tolerated IV fluids without problem. Compressive metabolic panel was well within normal limits with exception of the glucose being elevated at 335. The complete blood count was WITHIN NORMAL LIMITS. AFTER IV FLUIDS THE CAPILLARY BLOOD GLUCOSE WAS DOWN TO 210.  THE PATIENT WAS GIVEN A GLUCOSE METER STRIPS AND LANCETS BEFORE LEAVING THE EMERGENCY DEPARTMENT. ARRANGEMENTS HAVE BEEN MADE FOR HIM TO HAVE A MEDICAL CONSULTATION. THE PATIENT IS ADVISED TO RETURN TO THE EMERGENCY DEPARTMENT IMMEDIATELY IF ANY CHANGES, PROBLEMS, OR CONCERNS.    Final diagnoses:  Diabetes mellitus, new onset (HCC)  Flank pain    *I have reviewed nursing notes, vital signs, and all appropriate lab and imaging results for this patient.**    Ivery Quale, PA-C 07/18/15 2037  Vanetta Mulders, MD 07/19/15 [phone]

## 2015-07-14 NOTE — ED Notes (Signed)
Pa  at bedside. 

## 2015-07-14 NOTE — Discharge Instructions (Signed)
Your glucose on admission was over 350. Your urine revealed greater than 1000 mg/daL of glucose present. These findings are very suspicious for diabetes mellitus. Your urine does not show any signs of infection, or kidney stone. You have been given resource guide for physicians at this facility to assist you with this new onset diabetes. Please call and make an appointment as soon as possible. Please monitor your exercise and diet closely until seen by the consulting physicians. Please return to the emergency department immediately if any changes, problems, or concerns. May use Tylenol for your discomfort. Please avoid ibuprofen and nonsteroidal anti-inflammatory medications for now. Diabetes and Exercise Exercising regularly is important. It is not just about losing weight. It has many health benefits, such as:  Improving your overall fitness, flexibility, and endurance.  Increasing your bone density.  Helping with weight control.  Decreasing your body fat.  Increasing your muscle strength.  Reducing stress and tension.  Improving your overall health. People with diabetes who exercise gain additional benefits because exercise:  Reduces appetite.  Improves the body's use of blood sugar (glucose).  Helps lower or control blood glucose.  Decreases blood pressure.  Helps control blood lipids (such as cholesterol and triglycerides).  Improves the body's use of the hormone insulin by:  Increasing the body's insulin sensitivity.  Reducing the body's insulin needs.  Decreases the risk for heart disease because exercising:  Lowers cholesterol and triglycerides levels.  Increases the levels of good cholesterol (such as high-density lipoproteins [HDL]) in the body.  Lowers blood glucose levels. YOUR ACTIVITY PLAN  Choose an activity that you enjoy, and set realistic goals. To exercise safely, you should begin practicing any new physical activity slowly, and gradually increase the  intensity of the exercise over time. Your health care provider or diabetes educator can help create an activity plan that works for you. General recommendations include:  Encouraging children to engage in at least 60 minutes of physical activity each day.  Stretching and performing strength training exercises, such as yoga or weight lifting, at least 2 times per week.  Performing a total of at least 150 minutes of moderate-intensity exercise each week, such as brisk walking or water aerobics.  Exercising at least 3 days per week, making sure you allow no more than 2 consecutive days to pass without exercising.  Avoiding long periods of inactivity (90 minutes or more). When you have to spend an extended period of time sitting down, take frequent breaks to walk or stretch. RECOMMENDATIONS FOR EXERCISING WITH TYPE 1 OR TYPE 2 DIABETES   Check your blood glucose before exercising. If blood glucose levels are greater than 240 mg/dL, check for urine ketones. Do not exercise if ketones are present.  Avoid injecting insulin into areas of the body that are going to be exercised. For example, avoid injecting insulin into:  The arms when playing tennis.  The legs when jogging.  Keep a record of:  Food intake before and after you exercise.  Expected peak times of insulin action.  Blood glucose levels before and after you exercise.  The type and amount of exercise you have done.  Review your records with your health care provider. Your health care provider will help you to develop guidelines for adjusting food intake and insulin amounts before and after exercising.  If you take insulin or oral hypoglycemic agents, watch for signs and symptoms of hypoglycemia. They include:  Dizziness.  Shaking.  Sweating.  Chills.  Confusion.  Drink plenty of  water while you exercise to prevent dehydration or heat stroke. Body water is lost during exercise and must be replaced.  Talk to your health  care provider before starting an exercise program to make sure it is safe for you. Remember, almost any type of activity is better than none.   This information is not intended to replace advice given to you by your health care provider. Make sure you discuss any questions you have with your health care provider.   Document Released: 12/07/2003 Document Revised: 01/31/2015 Document Reviewed: 02/23/2013 Elsevier Interactive Patient Education [employer]

## 2015-07-14 NOTE — ED Notes (Signed)
pa at bedside. 

## 2015-08-18 ENCOUNTER — Ambulatory Visit: Payer: Managed Care, Other (non HMO) | Admitting: Podiatry

## 2017-05-20 ENCOUNTER — Emergency Department (HOSPITAL_COMMUNITY)
Admission: EM | Admit: 2017-05-20 | Discharge: 2017-05-20 | Disposition: A | Discharge status: Home / Self Care | Payer: Managed Care, Other (non HMO) | Attending: Emergency Medicine | Admitting: Emergency Medicine

## 2017-05-20 ENCOUNTER — Encounter (HOSPITAL_COMMUNITY): Payer: Self-pay | Admitting: Emergency Medicine

## 2017-05-20 DIAGNOSIS — R51 Headache: Secondary | ICD-10-CM | POA: Insufficient documentation

## 2017-05-20 DIAGNOSIS — Z041 Encounter for examination and observation following transport accident: Secondary | ICD-10-CM | POA: Insufficient documentation

## 2017-05-20 DIAGNOSIS — M25511 Pain in right shoulder: Secondary | ICD-10-CM | POA: Insufficient documentation

## 2017-05-20 DIAGNOSIS — M542 Cervicalgia: Secondary | ICD-10-CM | POA: Diagnosis not present

## 2017-05-20 DIAGNOSIS — M545 Low back pain: Secondary | ICD-10-CM | POA: Insufficient documentation
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
## 2017-05-20 NOTE — Discharge Instructions (Signed)
Take Ibuprofen or Tylenol for pain as directed Use a heating pad for sore muscles - use for 20 minutes several times a day Follow up with your doctor or orthopedics if symptoms are not improving
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
## 2017-06-05 NOTE — Progress Notes (Deleted)
Tawana ScaleZach Smith D.O. [facility] [address] [place], [place] [address] Phone: [phone] Subjective:    I'm seeing this patient by the request  of:    CC: left shoulder pain   EXB:[id number]:Subjective  James Hudson is a 41 y.o. male coming in with complaint of ***  Onset-  Location Duration-  Character- Aggravating factors- Reliving factors-  Therapies tried-  Severity-     No past medical history on file. No past surgical history on file. Social History   Social History  . Marital status: Married    Spouse name: N/A  . Number of children: N/A  . Years of education: N/A   Social History Main Topics  . Smoking status: Never Smoker  . Smokeless tobacco: Not on file  . Alcohol use No  . Drug use: No  . Sexual activity: Not on file   Other Topics Concern  . Not on file   Social History Narrative  . No narrative on file   No Known Allergies No family history on file. No family history of autoimmune diseases   Past medical history, social, surgical and family history all reviewed in electronic medical record.  No pertanent information unless stated regarding to the chief complaint.   Review of Systems:Review of systems updated and as accurate as of 06/05/17  No headache, visual changes, nausea, vomiting, diarrhea, constipation, dizziness, abdominal pain, skin rash, fevers, chills, night sweats, weight loss, swollen lymph nodes, body aches, joint swelling, muscle aches, chest pain, shortness of breath, mood changes.   Objective  There were no vitals taken for this visit. Systems examined below as of 06/05/17   General: No apparent distress alert and oriented x3 mood and affect normal, dressed appropriately.  HEENT: Pupils equal, extraocular movements intact  Respiratory: Patient's speak in full sentences and does not appear short of breath  Cardiovascular: No lower extremity edema, non tender, no erythema  Skin: Warm dry intact with no signs of  infection or rash on extremities or on axial skeleton.  Abdomen: Soft nontender  Neuro: Cranial nerves II through XII are intact, neurovascularly intact in all extremities with 2+ DTRs and 2+ pulses.  Lymph: No lymphadenopathy of posterior or anterior cervical chain or axillae bilaterally.  Gait normal with good balance and coordination.  MSK:  Non tender with full range of motion and good stability and symmetric strength and tone of elbows, wrist, hip, knee and ankles bilaterally.   Shoulder: left Inspection reveals no abnormalities, atrophy or asymmetry. Palpation is normal with no tenderness over AC joint or bicipital groove. ROM is full in all planes passively. Rotator cuff strength normal throughout. signs of impingement with positive Neer and Hawkin's tests, but negative empty can sign. Speeds and Yergason's tests normal. No labral pathology noted with negative Obrien's, negative clunk and good stability. Normal scapular function observed. No painful arc and no drop arm sign. No apprehension sign  MSK US performed of: left This study was ordered, performed, and interpreted by Terrilee FilesZach Smith D.O.  Shoulder:   Supraspinatus:  Appears normal on long and transverse views, Bursal bulge seen with shoulder abduction on impingement view. Infraspinatus:  Appears normal on long and transverse views. Significant increase in Doppler flow Subscapularis:  Appears normal on long and transverse views. Positive bursa Teres Minor:  Appears normal on long and transverse views. AC joint:  Capsule undistended, no geyser sign. Glenohumeral Joint:  Appears normal without effusion. Glenoid Labrum:  Intact without visualized tears. Biceps Tendon:  Appears normal on long and transverse views, no fraying of tendon, tendon located in intertubercular groove, no subluxation with shoulder internal or external rotation.  Impression: Subacromial bursitis  Procedure: Real-time Ultrasound Guided Injection of left  glenohumeral joint Device: GE Logiq E  Ultrasound guided injection is preferred based studies that show increased duration, increased effect, greater accuracy, decreased procedural pain, increased response rate with ultrasound guided versus blind injection.  Verbal informed consent obtained.  Time-out conducted.  Noted no overlying erythema, induration, or other signs of local infection.  Skin prepped in a sterile fashion.  Local anesthesia: Topical Ethyl chloride.  With sterile technique and under real time ultrasound guidance:  Joint visualized.  23g 1  inch needle inserted posterior approach. Pictures taken for needle placement. Patient did have injection of 2 cc of 1% lidocaine, 2 cc of 0.5% Marcaine, and 1.0 cc of Kenalog 40 mg/dL. Completed without difficulty  Pain immediately resolved suggesting accurate placement of the medication.  Advised to call if fevers/chills, erythema, induration, drainage, or persistent bleeding.  Images permanently stored and available for review in the ultrasound unit.  Impression: Technically successful ultrasound guided injection.    Impression and Recommendations:     This case required medical decision making of moderate complexity.      Note: This dictation was prepared with Dragon dictation along with smaller phrase technology. Any transcriptional errors that result from this process are unintentional.

## 2017-06-06 ENCOUNTER — Ambulatory Visit: Payer: Managed Care, Other (non HMO) | Admitting: Family Medicine

## 2017-09-29 ENCOUNTER — Encounter (HOSPITAL_BASED_OUTPATIENT_CLINIC_OR_DEPARTMENT_OTHER): Payer: Self-pay | Admitting: Emergency Medicine

## 2017-09-29 ENCOUNTER — Other Ambulatory Visit: Payer: Self-pay

## 2017-09-29 ENCOUNTER — Emergency Department (HOSPITAL_BASED_OUTPATIENT_CLINIC_OR_DEPARTMENT_OTHER)
Admission: EM | Admit: 2017-09-29 | Discharge: 2017-09-29 | Disposition: A | Discharge status: Home / Self Care | Payer: Managed Care, Other (non HMO) | Attending: Physician Assistant | Admitting: Physician Assistant

## 2017-09-29 DIAGNOSIS — Y999 Unspecified external cause status: Secondary | ICD-10-CM | POA: Insufficient documentation

## 2017-09-29 DIAGNOSIS — Y9389 Activity, other specified: Secondary | ICD-10-CM | POA: Insufficient documentation

## 2017-09-29 DIAGNOSIS — R51 Headache: Secondary | ICD-10-CM | POA: Insufficient documentation

## 2017-09-29 DIAGNOSIS — Y92410 Unspecified street and highway as the place of occurrence of the external cause: Secondary | ICD-10-CM | POA: Insufficient documentation
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
## 2018-06-15 NOTE — Progress Notes (Signed)
Chief Complaint  Patient presents with  . New Patient (Initial Visit)    establish care.  Told by nurse lots of sugar in urine.    HPI  His DOT physical showed some glucosuria and he was advised to get a PCP He drives long distance He does not exercise He does not have a family history of diabetes He eats fast food on the road Reports that he has been having increased urination and thirst "He cannot lose weight to save his life"  He ate raisin bran crunch He likes eggs and american cheese    Body mass index is 34.33 kg/m.   No past medical history on file.  Current Outpatient Medications  Medication Sig Dispense Refill  . metFORMIN (GLUCOPHAGE XR) 500 MG 24 hr tablet Take 1 tablet (500 mg total) by mouth 2 (two) times daily at 8 am and 10 pm. 60 tablet 3   No current facility-administered medications for this visit.     Allergies: No Known Allergies  No past surgical history on file.  Social History   Socioeconomic History  . Marital status: Married    Spouse name: Not on file  . Number of children: Not on file  . Years of education: Not on file  . Highest education level: Not on file  Occupational History  . Not on file  Social Needs  . Financial resource strain: Not on file  . Food insecurity:    Worry: Not on file    Inability: Not on file  . Transportation needs:    Medical: Not on file    Non-medical: Not on file  Tobacco Use  . Smoking status: Never Smoker  . Smokeless tobacco: Never Used  Substance and Sexual Activity  . Alcohol use: No  . Drug use: No  . Sexual activity: Not on file  Lifestyle  . Physical activity:    Days per week: Not on file    Minutes per session: Not on file  . Stress: Not on file  Relationships  . Social connections:    Talks on phone: Not on file    Gets together: Not on file    Attends religious service: Not on file    Active member of club or organization: Not on file    Attends meetings of clubs or  organizations: Not on file    Relationship status: Not on file  Other Topics Concern  . Not on file  Social History Narrative  . Not on file    No family history on file.   ROS Review of Systems See HPI Constitution: No fevers or chills No malaise No diaphoresis Skin: No rash or itching Eyes: no blurry vision, no double vision GU: no dysuria or hematuria Neuro: no dizziness or headaches * all others reviewed and negative   Objective: Vitals:   06/16/18 0957  BP: 118/86  Pulse: 92  Resp: 16  Temp: 99 F (37.2 C)  TempSrc: Oral  SpO2: 98%  Weight: 258 lb 6.4 oz (117.2 kg)  Height: 6' 0.75" (1.848 m)    Physical Exam Physical Exam  Constitutional: She is oriented to person, place, and time. She appears well-developed and well-nourished.  HENT:  Head: Normocephalic and atraumatic.  Eyes: Conjunctivae and EOM are normal.  Cardiovascular: Normal rate, regular rhythm and normal heart sounds.   Pulmonary/Chest: Effort normal and breath sounds normal. No respiratory distress. She has no wheezes.  Abdominal: Normal appearance and bowel sounds are normal. There is no tenderness.  There is no CVA tenderness.  Neurological: She is alert and oriented to person, place, and time.    Assessment and Plan James Hudson was seen today for new patient (initial visit).  Diagnoses and all orders for this visit:  Newly diagnosed diabetes (HCC) -     Comprehensive metabolic panel -     Microalbumin, urine -     Lipid panel -     metFORMIN (GLUCOPHAGE XR) 500 MG 24 hr tablet; Take 1 tablet (500 mg total) by mouth 2 (two) times daily at 8 am and 10 pm. -     Ambulatory referral to Ophthalmology  - diagnosis made today  -  Performed nutritional counseling -  Started metformin  -  Gave vaccines -  Referred to Ophthalmology -  Will screen for lipid disorders and metabolic syndrome Time spent 40 minutes with more than half the time spent counseling on diabetes as a new diagnosis,  arranging referrals and discussing medications and expected response to treatment  -   Next visit will teach glucose monitoring -   Diabetic foot exam   Glucose found in urine on examination -     POCT urinalysis dipstick -     POCT glycosylated hemoglobin (Hb A1C)  Class 1 obesity due to excess calories with serious comorbidity and body mass index (BMI) of 33.0 to 33.9 in adult  Need for prophylactic vaccination against Streptococcus pneumoniae (pneumococcus)     Talin Rozeboom A Yeraldi Fidler

## 2018-06-16 ENCOUNTER — Telehealth: Payer: Self-pay | Admitting: Family Medicine

## 2018-06-16 ENCOUNTER — Encounter: Payer: Self-pay | Admitting: Family Medicine

## 2018-06-16 ENCOUNTER — Ambulatory Visit (INDEPENDENT_AMBULATORY_CARE_PROVIDER_SITE_OTHER): Payer: 59 | Admitting: Family Medicine

## 2018-06-16 ENCOUNTER — Other Ambulatory Visit: Payer: Self-pay

## 2018-06-16 VITALS — BP 118/86 | HR 92 | Temp 99.0°F | Resp 16 | Ht 72.75 in | Wt 258.4 lb

## 2018-06-16 DIAGNOSIS — E119 Type 2 diabetes mellitus without complications: Secondary | ICD-10-CM

## 2018-06-16 DIAGNOSIS — R81 Glycosuria: Secondary | ICD-10-CM

## 2018-06-16 DIAGNOSIS — E6609 Other obesity due to excess calories: Secondary | ICD-10-CM | POA: Diagnosis not present

## 2018-06-16 DIAGNOSIS — Z23 Encounter for immunization: Secondary | ICD-10-CM

## 2018-06-16 DIAGNOSIS — Z6833 Body mass index (BMI) 33.0-33.9, adult: Secondary | ICD-10-CM

## 2018-06-16 LAB — POCT GLYCOSYLATED HEMOGLOBIN (HGB A1C): HEMOGLOBIN A1C: 8.7 % — AB (ref 4.0–5.6)

## 2018-06-16 LAB — POCT URINALYSIS DIP (MANUAL ENTRY)
BILIRUBIN UA: NEGATIVE mg/dL
Bilirubin, UA: NEGATIVE
Blood, UA: NEGATIVE
Leukocytes, UA: NEGATIVE
Nitrite, UA: NEGATIVE
Protein Ur, POC: NEGATIVE mg/dL
SPEC GRAV UA: 1.015 (ref 1.010–1.025)
Urobilinogen, UA: 0.2 E.U./dL
pH, UA: 6 (ref 5.0–8.0)

## 2018-06-16 MED ORDER — METFORMIN HCL ER 500 MG PO TB24: 500.0000 mg | ORAL_TABLET | Freq: Two times a day (BID) | ORAL | 3 refills | Status: DC

## 2018-06-16 NOTE — Telephone Encounter (Signed)
Copied from CRM [id number]. Topic: General - Other >> Jun 16, 2018  3:35 PM Marylen PontoMcneil, Ja-Kwan wrote: Reason for CRM: Pt states he forgot to ask Dr Creta LevinStallings about what he can take for the rash on his back. Pt requests call back.

## 2018-06-16 NOTE — Telephone Encounter (Unsigned)
Copied from CRM [id number]. Topic: Quick Communication - See Telephone Encounter >> Jun 16, 2018  4:16 PM Floria RavelingStovall, Shana A wrote: CRM for notification. See Telephone encounter for: 06/16/18. Pt called in and stated that ins does not cover pt meds.   The metFORMIN (GLUCOPHAGE XR) 500 MG 24 hr tablet [[id number]][[id number]] needs to [facility] to [facility] [address].  [facility]

## 2018-06-16 NOTE — Patient Instructions (Addendum)
 Low Glycemic Foods (20-49)  Breakfast Cereals: All-Bran                All-Bran Fruit ' n Oats Fiber One               Oatmeal (not instant)  Oat bran Fruits and fruit juices: (Limit to 1-2 servings per day) Apples               Apricots (fresh & dried)  Blackberries            Blueberries Cherries                  Cranberries             Peaches                  Pears                       Plums                       Prunes Grapefruit                Raspberries            Strawberries           Tangerine Apple juice             Grapefruit juice Tomato juice  Beans and legumes (fresh-cooked): Black-eyed peas     Butter beans Chick peas              Lentils     Green beans           Lima beans               Kidney beans          Navy beans  Non-starchy vegetables: Asparagus, bok choy, broccoli, cabbage, cauliflower, celery, cucumber, greens, lettuce, mushrooms, peppers, tomatoes, okra, onions, snow peas, spinach, summer squash  Grains: Barley                                Bulgur Rye                                    Wild rice  Nuts and oils : Almonds         Peanuts     Sunflower seeds  Hazelnuts      Pecans          Walnuts Oils that are liquid at room temperature  Dairy, fish, and meat: Milk, skim                         Lowfat cheese Yogurt, lowfat, fruit sugar sweetened Lean red meat                      Fish  Skinless chicken & turkey    Shellfish Moderate Glycemic Foods (50-69)  Breakfast Cereals: Bran Buds                             Bran Chex Just Right                              Mini-Wheats Special K         Swiss muesli  Fruits: Banana (under-ripe)             Dates Figs                                      Grapes Kiwi                                      Mango Oranges                               Raisins  Fruit Juices: Cranberry juice                    Orange juice  Beans and legumes: Boston-type baked beans Canned pinto, kidney,  or navy beans Green peas  Vegetables: Beets                         Raw Carrots  Sweet potato              Yam Corn on the cob  Breads: Pita (pocket) bread          Oat bran bread Pumpernickel bread           Rye bread Wheat bread, high fiber       Grains: Cornmeal                           Rice, brown   Rice, white                         Couscous Pasta: Macaroni                           Pizza  cheese Raviolimeat filled           Spaghetti, white        Nuts: Cashews                           Macadamia  Snacks: Chocolate                    Ice cream,lowfat  Muffin                               Popcorn High Glycemic Foods (70-100)   Breakfast Cereals: Cheerios                 Corn Chex Corn Flakes            Cream of Wheat Grape Nuts              Grape Nut Flakes Life                 Nutri-Grain       Puffed Rice               Puffed Wheat Rice Chex                 Rice Krispies Shredded Wheat               Team Total Fruits: Pineapple                 Watermelon Banana (over-ripe)  Beverages: Sodas, sweet tea, pineapple juice  Vegetables: Potato, baked, boiled, fried, mashed French fries Canned or frozen corn Cooked carrots Parsnips Winter squash  Breads: Most breads (white and whole grain) Bagels                     Bread sticks Bread stuffing          Kaiser roll Dinner rolls  Grains: Rice, instant          Tapioca, with milk  Candy and most cookies Snacks: Donuts                      Corn chips        Jelly beans                 Pretzels Pastries                             Restaurant and ethnic foods Most Chinese food (sugar in stir fry or wok sauces) Teriyaki-style meats and vegetables     If you have lab work done today you will be contacted with your lab results within the next 2 weeks.  If you have not heard from us then please contact us. The fastest way to get your results is to register for My Chart.   IF you received an  x-ray today, you will receive an invoice from [facility]. Please contact [facility] at [phone] with questions or concerns regarding your invoice.   IF you received labwork today, you will receive an invoice from [facility]. Please contact [facility] at [phone] with questions or concerns regarding your invoice.   Our billing staff will not be able to assist you with questions regarding bills from these companies.  You will be contacted with the lab results as soon as they are available. The fastest way to get your results is to activate your My Chart account. Instructions are located on the last page of this paperwork. If you have not heard from us regarding the results in 2 weeks, please contact this office.     

## 2018-06-17 ENCOUNTER — Other Ambulatory Visit: Payer: Self-pay

## 2018-06-17 DIAGNOSIS — E119 Type 2 diabetes mellitus without complications: Secondary | ICD-10-CM

## 2018-06-17 LAB — COMPREHENSIVE METABOLIC PANEL
ALBUMIN: 5 g/dL (ref 3.5–5.5)
ALT: 32 IU/L (ref 0–44)
AST: 21 IU/L (ref 0–40)
Albumin/Globulin Ratio: 2.2 (ref 1.2–2.2)
Alkaline Phosphatase: 65 IU/L (ref 39–117)
BILIRUBIN TOTAL: 0.3 mg/dL (ref 0.0–1.2)
BUN / CREAT RATIO: 18 (ref 9–20)
BUN: 16 mg/dL (ref 6–24)
CALCIUM: 10.3 mg/dL — AB (ref 8.7–10.2)
CHLORIDE: 103 mmol/L (ref 96–106)
CO2: 20 mmol/L (ref 20–29)
CREATININE: 0.88 mg/dL (ref 0.76–1.27)
GFR calc non Af Amer: 107 mL/min/{1.73_m2} (ref >59)
GFR, EST AFRICAN AMERICAN: 123 mL/min/{1.73_m2} (ref >59)
GLUCOSE: 146 mg/dL — AB (ref 65–99)
Globulin, Total: 2.3 g/dL (ref 1.5–4.5)
Potassium: 4.8 mmol/L (ref 3.5–5.2)
Sodium: 144 mmol/L (ref 134–144)
TOTAL PROTEIN: 7.3 g/dL (ref 6.0–8.5)

## 2018-06-17 LAB — LIPID PANEL
CHOL/HDL RATIO: 5.5 ratio — AB (ref 0.0–5.0)
Cholesterol, Total: 126 mg/dL (ref 100–199)
HDL: 23 mg/dL — AB (ref >39)
LDL Calculated: 50 mg/dL (ref 0–99)
Triglycerides: 263 mg/dL — ABNORMAL HIGH (ref 0–149)
VLDL CHOLESTEROL CAL: 53 mg/dL — AB (ref 5–40)

## 2018-06-17 LAB — MICROALBUMIN, URINE: MICROALBUM., U, RANDOM: 8.3 ug/mL

## 2018-06-17 MED ORDER — METFORMIN HCL ER 500 MG PO TB24: 500.0000 mg | ORAL_TABLET | Freq: Two times a day (BID) | ORAL | 3 refills | Status: DC

## 2018-06-17 NOTE — Telephone Encounter (Signed)
[facility] to [facility] on [address]

## 2018-06-17 NOTE — Telephone Encounter (Signed)
Please advise 

## 2018-07-03 NOTE — Telephone Encounter (Signed)
Spoke with pt via phone an advise that she did not take a picture of the rash or documented the rash as most of the visit was focused on the diabetes.  She will be sure to address it next visit.  He should use only mild soap like dove and if it is itchy to come in so I can see it.  And she  doesn't want to prescribe the wrong medication and make it worse so she will definitely address this in great detail next visit.  Pt agreeable an appreciative of call back. Dgaddy, CMA

## 2018-07-03 NOTE — Telephone Encounter (Signed)
Let him know that I did not take a picture of the rash or documented the rash as most of the visit was focused on the diabetes. I will be sure to address it next visit.  He should use only mild soap like dove and if it is itchy to come in so I can see it.  I don't want to prescribe the wrong medication and make it worse so I will definitely address this in great detail next visit.

## 2018-07-28 ENCOUNTER — Ambulatory Visit (INDEPENDENT_AMBULATORY_CARE_PROVIDER_SITE_OTHER): Payer: 59 | Admitting: Family Medicine

## 2018-07-28 ENCOUNTER — Encounter: Payer: Self-pay | Admitting: Family Medicine

## 2018-07-28 ENCOUNTER — Other Ambulatory Visit: Payer: Self-pay

## 2018-07-28 VITALS — BP 134/86 | HR 78 | Temp 98.5°F | Resp 16 | Ht 72.75 in | Wt 264.6 lb

## 2018-07-28 DIAGNOSIS — B360 Pityriasis versicolor: Secondary | ICD-10-CM | POA: Diagnosis not present

## 2018-07-28 DIAGNOSIS — E1169 Type 2 diabetes mellitus with other specified complication: Secondary | ICD-10-CM

## 2018-07-28 DIAGNOSIS — Z6833 Body mass index (BMI) 33.0-33.9, adult: Secondary | ICD-10-CM

## 2018-07-28 DIAGNOSIS — E1165 Type 2 diabetes mellitus with hyperglycemia: Secondary | ICD-10-CM | POA: Insufficient documentation

## 2018-07-28 DIAGNOSIS — E6609 Other obesity due to excess calories: Secondary | ICD-10-CM

## 2018-07-28 LAB — GLUCOSE, POCT (MANUAL RESULT ENTRY): POC GLUCOSE: 115 mg/dL — AB (ref 70–99)

## 2018-07-28 MED ORDER — FLUCONAZOLE 150 MG PO TABS: 300.0000 mg | ORAL_TABLET | ORAL | 0 refills | Status: DC

## 2018-07-28 NOTE — Progress Notes (Signed)
Chief Complaint  Patient presents with  . blood sugar check    4 week f/u    HPI  Diabetes Mellitus: Patient presents for follow up of diabetes. Last visit he was started on metformin and given pneumovax 23 He had abnormal triglycerides and normal cholesterol.   He reports that he cut back on sugary drinks  He states that he also cut back on his metformin due to feeling so good He has less urinary symptoms He has been eating a lot less He reports that he has been feeling very good Less urinary symptoms and decreased thirst He ate at 6 am this morning and had a bowl of oatmeal  Obesity Body mass index is 35.15 kg/m. 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  Current Outpatient Medications  Medication Sig Dispense Refill  . fluconazole (DIFLUCAN) 150 MG tablet Take 2 tablets (300 mg total) by mouth once a week. 2 tablet 0  . metFORMIN (GLUCOPHAGE XR) 500 MG 24 hr tablet Take 1 tablet (500 mg total) by mouth 2 (two) times daily at 8 am and 10 pm. 60 tablet 3   No current facility-administered medications for this visit.     Allergies: No Known Allergies  No past surgical history on file.  Social History   Socioeconomic History  . Marital status: Married    Spouse name: Not on file  . Number of children: Not on file  . Years of education: Not on file  . Highest education level: Not on file  Occupational History  . Not on file  Social Needs  . Financial resource strain: Not on file  . Food insecurity:   Worry: Not on file    Inability: Not on file  . Transportation needs:    Medical: Not on file    Non-medical: Not on file  Tobacco Use  . Smoking status: Never Smoker  . Smokeless tobacco: Never Used  Substance and Sexual Activity  . Alcohol use: No  . Drug use: No  . Sexual activity: Not on file  Lifestyle  . Physical activity:    Days per week: Not on file    Minutes per session: Not on file  . Stress: Not on file  Relationships  . Social connections:    Talks on phone: Not on file    Gets together: Not on file    Attends religious service: Not on file    Active member of club or organization: Not on file    Attends meetings of clubs or organizations: Not on file    Relationship status: Not on file  Other Topics Concern  . Not on file  Social History Narrative  . Not on file    No family history on file.   ROS Review of Systems See HPI Constitution: No fevers or chills No malaise No diaphoresis Skin: No rash or itching Eyes: no blurry vision, no double vision GU: no dysuria or hematuria  Neuro: no dizziness or headaches * all others reviewed and negative   Objective: Vitals:   07/28/18 0829  BP: 134/86  Pulse: 78  Resp: 16  Temp: 98.5 F (36.9 C)  TempSrc: Oral  SpO2: 99%  Weight: 264 lb 9.6 oz (120 kg)  Height: 6' 0.75" (1.848 m)    Physical Exam  Constitutional: He is oriented to person, place, and time. He appears well-developed and well-nourished.  HENT:  Head: Normocephalic and atraumatic.  Eyes: Conjunctivae and EOM are normal.  Cardiovascular: Normal rate, regular rhythm and normal heart sounds.  No murmur heard. Neurological: He is alert and oriented to person, place, and time.  Skin: Skin is warm. Capillary refill takes less than 2 seconds.  Flat hypopigmentation and silver patches noted on the neck and back  Psychiatric: He has a normal mood and affect. His behavior is normal. Judgment and thought content normal.    Assessment and  Plan Alcus was seen today for blood sugar check.  Diagnoses and all orders for this visit:  Type 2 diabetes mellitus with other specified complication, unspecified whether long term insulin use (HCC)- manual glucose in normal range Discussed diabetes management -     POCT glucose (manual entry)  Class 1 obesity due to excess calories with serious comorbidity and body mass index (BMI) of 33.0 to 33.9 in adult- worsening Discussed dietary modifications Reviewed reading the nutrition labels  Tinea versicolor-  Advised selsun blue for 10 minutes a day for one week  Will treat with oral antifungal fluconazole 300mg  once a week for 2 weeks Reviewed liver function on the CMP  Other orders -     fluconazole (DIFLUCAN) 150 MG tablet; Take 2 tablets (300 mg total) by mouth once a week.    Follow up in December 2019 for repeat a1c and diabetes follow up  James Hudson A Tionne Carelli

## 2018-07-28 NOTE — Patient Instructions (Addendum)
Tinea Versicolor Use selsun blue - apply like a paste to skin for 10 minutes, then wash off. Do this daily for 7 days. Take the ketoconazole pill once a week for 2 weeks.       Low Glycemic Foods (20-49)  Breakfast Cereals: All-Bran                All-Bran Fruit ' n Oats Fiber One               Oatmeal (not instant)  Oat bran Fruits and fruit juices: (Limit to 1-2 servings per day) Apples               Apricots (fresh & dried)  Blackberries            Blueberries Cherries                  Cranberries             Peaches                  Pears                       Plums                       Prunes Grapefruit                Raspberries            Strawberries           Tangerine Apple juice             Grapefruit juice Tomato juice  Beans and legumes (fresh-cooked): Black-eyed peas     Butter beans Chick peas              Lentils     Green beans           Lima beans               Kidney beans          Navy beans  Non-starchy vegetables: Asparagus, bok choy, broccoli, cabbage, cauliflower, celery, cucumber, greens, lettuce, mushrooms, peppers, tomatoes, okra, onions, snow peas, spinach, summer squash  Grains: Barley                                Bulgur Rye                                    Wild rice  Nuts and oils : Almonds         Peanuts     Sunflower seeds  Hazelnuts      Pecans          Walnuts Oils that are liquid at room temperature  Dairy, fish, and meat: Milk, skim                         Lowfat cheese Yogurt, lowfat, fruit sugar sweetened Lean red meat                      Fish  Skinless chicken & [place]    Shellfish Moderate Glycemic Foods (50-69)  Breakfast Cereals: Bran Buds  Bran Chex Just Right                            Mini-Wheats Special K         Swiss muesli  Fruits: Banana (under-ripe)             Dates Figs                                      Grapes Kiwi                                       Mango Oranges                               Raisins  Fruit Juices: Cranberry juice                    Orange juice  Beans and legumes: [place]-type baked beans Canned pinto, kidney, or navy beans Green peas  Vegetables: Beets                         Raw Carrots  Sweet potato              Yam Corn on the cob  Breads: Pita (pocket) bread          Oat bran bread Pumpernickel bread           Rye bread Wheat bread, high fiber       Grains: Cornmeal                           Rice, brown   Rice, white                         Couscous Pasta: Macaroni                           Pizza  cheese Raviolimeat filled           Spaghetti, white        Nuts: Cashews                           Macadamia  Snacks: Chocolate                    Ice cream,lowfat  Muffin                               Popcorn High Glycemic Foods (70-100)   Breakfast Cereals: Cheerios                 Corn Chex Corn Flakes            Cream of Wheat Grape Nuts              Grape Nut Flakes Life                 Nutri-Grain       Puffed Rice  Puffed Wheat Rice Chex                 Rice Krispies Shredded Wheat             Team Total Fruits: Pineapple                 Watermelon Banana (over-ripe)  Beverages: Sodas, sweet tea, pineapple juice  Vegetables: Potato, baked, boiled, fried, mashed [place] fries Canned or frozen corn Cooked carrots Parsnips Winter squash  Breads: Most breads (white and whole grain) Bagels                     Bread sticks Bread stuffing          Kaiser roll Dinner rolls  Grains: Rice, instant          Tapioca, with milk  Candy and most cookies Snacks: Donuts                      Corn chips        Jelly beans                 Pretzels Pastries                             Restaurant and ethnic foods Most [place] food (sugar in stir fry or wok sauces) Teriyaki-style meats and vegetables      If you have lab work done today you will be contacted  with your lab results within the next 2 weeks.  If you have not heard from [place] then please contact us. The fastest way to get your results is to register for My Chart.   IF you received an x-ray today, you will receive an invoice from [facility]. Please contact [facility] at [phone] with questions or concerns regarding your invoice.   IF you received labwork today, you will receive an invoice from [facility]. Please contact [facility] at [phone] with questions or concerns regarding your invoice.   Our billing staff will not be able to assist you with questions regarding bills from these companies.  You will be contacted with the lab results as soon as they are available. The fastest way to get your results is to activate your My Chart account. Instructions are located on the last page of this paperwork. If you have not heard from [place] regarding the results in 2 weeks, please contact this office.    Tinea Versicolor Tinea versicolor is a common fungal infection of the skin. It causes a rash that appears as light or dark patches on the skin. The rash most often occurs on the chest, back, neck, or upper arms. This condition is more common during warm weather. Other than affecting how your skin looks, tinea versicolor usually does not cause other problems. In most cases, the infection goes away in a few weeks with treatment. It may take a few months for the patches on your skin to clear up. What are the causes? Tinea versicolor occurs when a type of fungus that is normally present on the skin starts to overgrow. This fungus is a kind of yeast. The exact cause of the overgrowth is not known. This condition cannot be passed from one person to another (noncontagious). What increases the risk? This condition is more likely to develop when certain factors are present, such as:  Heat and humidity.  Sweating too much.  Hormone  changes.  Oily skin.  A weak defense (immune)  system.  What are the signs or symptoms? Symptoms of this condition may include:  A rash on your skin that is made up of light or dark patches. The rash may have: ? Patches of tan or pink spots on light skin. ? Patches of white or brown spots on dark skin. ? Patches of skin that do not tan. ? Well-marked edges. ? Scales on the discolored areas.  Mild itching.  How is this diagnosed? A health care provider can usually diagnose this condition by looking at your skin. During the exam, he or she may use ultraviolet light to help determine the extent of the infection. In some cases, a skin sample may be taken by scraping the rash. This sample will be viewed under a microscope to check for yeast overgrowth. How is this treated? Treatment for this condition may include:  Dandruff shampoo that is applied to the affected skin during showers or bathing.  Over-the-counter medicated skin cream, lotion, or soaps.  Prescription antifungal medicine in the form of skin cream or pills.  Medicine to help reduce itching.  Follow these instructions at home:  Take medicines only as directed by your health care provider.  Apply dandruff shampoo to the affected area if told to do so by your health care provider. You may be instructed to scrub the affected skin for several minutes each day.  Do not scratch the affected area of skin.  Avoid hot and humid conditions.  Do not use tanning booths.  Try to avoid sweating a lot. Contact a health care provider if:  Your symptoms get worse.  You have a fever.  You have redness, swelling, or pain at the site of your rash.  You have fluid, blood, or pus coming from your rash.  Your rash returns after treatment. This information is not intended to replace advice given to you by your health care provider. Make sure you discuss any questions you have with your health care provider. Document Released: 09/13/2000 Document Revised: 05/19/2016 Document  Reviewed: 06/28/2014 Elsevier Interactive Patient Education  2018 [employer].

## 2018-08-03 ENCOUNTER — Encounter: Payer: Self-pay | Admitting: Emergency Medicine

## 2018-08-03 ENCOUNTER — Other Ambulatory Visit: Payer: Self-pay

## 2018-08-03 ENCOUNTER — Ambulatory Visit: Payer: Self-pay | Admitting: Emergency Medicine

## 2018-08-03 VITALS — BP 142/89 | HR 76 | Temp 98.5°F | Resp 18 | Ht 73.62 in | Wt 264.4 lb

## 2018-08-03 DIAGNOSIS — Z024 Encounter for examination for driving license: Secondary | ICD-10-CM

## 2018-08-03 NOTE — Patient Instructions (Addendum)

## 2018-08-03 NOTE — Progress Notes (Signed)
This patient presents for DOT examination for fitness for duty.   Medical History:  1. Head/Brain Injuries, disorders or illnesses no  2. Seizures, epilepsy no  3. Eye disorders or impaired vision (except corrective lenses) no  4. Ear disorders, loss of hearing or balance no  5. Heart disease or heart attack, other cardiovascular condition no  6. Heart surgery (valve replacement/bypass, angioplasty, pacemaker/defribrillator) no  7. High blood pressure no  8. High cholesterol no  9. Chronic cough, shortness of breath or other breathing problems no  10. Lung disease (emphysema, asthma or chronic bronchitis) no  11. Kidney disease, dialysis no  12. Digestive problems  no  13. Diabetes or elevated blood sugar yes                      If yes to #13, Insulin use no  14. Nervious or psychiatric disorders, e.g., severe depression no  15. Fainting or syncope no  16. Dizziness, headaches, numbness, tingling or memory loss no  17. Unexplained weight loss no  18. Stroke, TIA or paralysis no  19. Missing or impaired hand, arm, foot, leg, finger, toe no  20. Spinal injury or disease no  21. Bone, muscles or nerve problems no  22. Blood clots or bleeding bleeding disorders no  23. Cancer no  24. Chronic infection or other chronic diseases no  25. Sleep disorders, pauses in breathing while asleep, daytime sleepiness, loud snoring no  26. Have you ever had a sleep test? no  27.  Have you ever spent a night in the hospital? no  28. Have you ever had a broken bone? yes  29. Have you or or do you use tobacco products? no  30. Regular, frequent alcohol use no  31. Illegal substance use within the past 2 years no  32.  Have you ever failed a drug test or been dependent on an illegal substance? no   Current Medications: Prior to Admission medications   Medication Sig Start Date End Date Taking? Authorizing Provider  metFORMIN (GLUCOPHAGE XR) 500 MG 24 hr tablet Take 1 tablet (500 mg total)  by mouth 2 (two) times daily at 8 am and 10 pm. 06/17/18  Yes Stallings, Zoe A, MD  fluconazole (DIFLUCAN) 150 MG tablet Take 2 tablets (300 mg total) by mouth once a week. Patient not taking: Reported on 08/03/2018 07/28/18   Doristine Bosworth, MD    Medical Examiner's Comments on Health History:  Recently diagnosed with diabetes.  On metformin.  Doing well.    TESTING:   Visual Acuity Screening   Right eye Left eye Both eyes  Without correction: 20/13 20/13 20/13   With correction:     Comments: Passed color test.   Horizontal field of vision is 85 degrees.   Hearing Screening Comments: Passed whisper test.   Monocular Vision: No.  Hearing Aid used for test: No. Hearing Aid required to to meet standard: No.  BP (!) 142/89   Pulse 76   Temp 98.5 F (36.9 C) (Oral)   Resp 18   Ht 6' 1.62" (1.87 m)   Wt 264 lb 6.4 oz (119.9 kg)   SpO2 100%   BMI 34.30 kg/m  Pulse rate is regular  Comments: UA  GL 500  SG 1.020   PR neg  BL neg   PHYSICAL EXAMINATION:  General Appearance Not markedly obese. No tremor, signs of alcoholism, problem drinking or drug abuse.   Skin Warm,  dry and intact.   Eyes Pupils are equal, round and reactive to light and accommodation, extraocular movements are intact. No exophthalmos, no nystagmus.   Ears Normal external ears. External canal without occlusion. No scarring of the TM. No perforation of the TM.  Mouth and Throat Clear and moist. No irremedial deformities likely to interfere with breathing or swallowing.  Heart No murmurs, extra sounds, evidence of cardiomegaly. No pacemaker. No implantable defibrillator.  Lungs and Chest (excluding breasts) Normal chest expansion, respiratory rate, breath sounds. No cyanosis.  Abdomen and Viscera No liver enlargement. No splenic enlargement. No masses, bruits, hernias or significant abdominal wall weakness.  Genitourinary  No inguinal or femoral hernia.  Spine and other musculoskeletal No tenderness, no  limitation of motion, no deformities. No evidence of previous surgery.  Extremities No loss or impairment of leg, foot, toe, arm, hand, finger. No perceptible limp, deformities, atrophy, weakness, paralysis, clubbing, edema, hypotonia. Patient has sufficient grasp and prehension to maintain steering wheel grip. Patient has sufficient mobility and strength in the lower limbs to operate pedals properly.  Neurologic Normal equilibrium, coordination, speech pattern. No paresthesia, asymmetry of deep tendon reflexes, sensory or positional abnormalities. No abnormality of patellar or Babinski's reflexes.  Gait Not antalgic or ataxic  Vascular Normal pulses. No carotid or arterial bruits. No varicose veins.     Certification Status: does meet standards for 2 year certificate.  Certification expires 08/03/2020.

## 2018-08-12 ENCOUNTER — Ambulatory Visit: Payer: 59 | Admitting: Nurse Practitioner

## 2018-08-19 ENCOUNTER — Other Ambulatory Visit: Payer: Self-pay | Admitting: Family Medicine

## 2018-08-19 DIAGNOSIS — E119 Type 2 diabetes mellitus without complications: Secondary | ICD-10-CM

## 2018-08-25 ENCOUNTER — Ambulatory Visit: Payer: 59 | Admitting: Family Medicine

## 2018-09-21 ENCOUNTER — Encounter: Payer: Self-pay | Admitting: Family Medicine

## 2018-09-21 ENCOUNTER — Other Ambulatory Visit: Payer: Self-pay

## 2018-09-21 ENCOUNTER — Ambulatory Visit (INDEPENDENT_AMBULATORY_CARE_PROVIDER_SITE_OTHER): Payer: 59 | Admitting: Family Medicine

## 2018-09-21 VITALS — BP 128/83 | HR 75 | Temp 98.4°F | Resp 17 | Ht 73.62 in | Wt 267.4 lb

## 2018-09-21 DIAGNOSIS — E6609 Other obesity due to excess calories: Secondary | ICD-10-CM

## 2018-09-21 DIAGNOSIS — E1169 Type 2 diabetes mellitus with other specified complication: Secondary | ICD-10-CM

## 2018-09-21 DIAGNOSIS — Z6833 Body mass index (BMI) 33.0-33.9, adult: Secondary | ICD-10-CM

## 2018-09-21 LAB — POCT GLYCOSYLATED HEMOGLOBIN (HGB A1C): Hemoglobin A1C: 7.4 % — AB (ref 4.0–5.6)

## 2018-09-21 LAB — GLUCOSE, POCT (MANUAL RESULT ENTRY): POC Glucose: 126 mg/dl — AB (ref 70–99)

## 2018-09-21 NOTE — Patient Instructions (Addendum)
   Ernesto Rutherfordobert Groat O.D. at [facility] [phone] Call for an eye exam for diabetes   If you have lab work done today you will be contacted with your lab results within the next 2 weeks.  If you have not heard from us then please contact us. The fastest way to get your results is to register for My Chart.   IF you received an x-ray today, you will receive an invoice from [facility]. Please contact [facility] at [phone] with questions or concerns regarding your invoice.   IF you received labwork today, you will receive an invoice from [facility]. Please contact [facility] at [phone] with questions or concerns regarding your invoice.   Our billing staff will not be able to assist you with questions regarding bills from these companies.  You will be contacted with the lab results as soon as they are available. The fastest way to get your results is to activate your My Chart account. Instructions are located on the last page of this paperwork. If you have not heard from us regarding the results in 2 weeks, please contact this office.

## 2018-09-21 NOTE — Progress Notes (Signed)
Established Patient Office Visit  Subjective:  Patient ID: James Hudson, male    DOB: 1976/07/21  Age: 42 y.o. MRN: [id number]  CC:  Chief Complaint  Patient presents with  . Diabetes    4 week f/u    HPI James Hudson presents for   Diabetes Mellitus: Patient presents for follow up of diabetes. Symptoms: none.  Patient denies nausea, paresthesia of the feet, polydipsia, polyuria, vomitting and weight loss.  Evaluation to date has been included: hemoglobin A1C.  Home sugars: patient does not check sugars. Treatment to date: Continued metformin which has been effective.    Obesity Pt reports that he is exercising and eating much better and trying to get his family to do the same. He denies any chest pains, fatigue, sob with exercise.  Wt Readings from Last 3 Encounters:  09/21/18 267 lb 6.4 oz (121.3 kg)  08/03/18 264 lb 6.4 oz (119.9 kg)  07/28/18 264 lb 9.6 oz (120 kg)     No past medical history on file.  No past surgical history on file.  No family history on file.  Social History   Socioeconomic History  . Marital status: Married    Spouse name: Not on file  . Number of children: Not on file  . Years of education: Not on file  . Highest education level: Not on file  Occupational History  . Not on file  Social Needs  . Financial resource strain: Not on file  . Food insecurity:    Worry: Not on file    Inability: Not on file  . Transportation needs:    Medical: Not on file    Non-medical: Not on file  Tobacco Use  . Smoking status: Never Smoker  . Smokeless tobacco: Never Used  Substance and Sexual Activity  . Alcohol use: No  . Drug use: No  . Sexual activity: Not on file  Lifestyle  . Physical activity:    Days per week: Not on file    Minutes per session: Not on file  . Stress: Not on file  Relationships  . Social connections:    Talks on phone: Not on file    Gets together: Not on file    Attends religious service: Not on file   Active member of club or organization: Not on file    Attends meetings of clubs or organizations: Not on file    Relationship status: Not on file  . Intimate partner violence:    Fear of current or ex partner: Not on file    Emotionally abused: Not on file    Physically abused: Not on file    Forced sexual activity: Not on file  Other Topics Concern  . Not on file  Social History Narrative  . Not on file    Outpatient Medications Prior to Visit  Medication Sig Dispense Refill  . metFORMIN (GLUCOPHAGE XR) 500 MG 24 hr tablet Take 1 tablet (500 mg total) by mouth 2 (two) times daily at 8 am and 10 pm. 60 tablet 3  . fluconazole (DIFLUCAN) 150 MG tablet Take 2 tablets (300 mg total) by mouth once a week. (Patient not taking: Reported on 08/03/2018) 2 tablet 0   No facility-administered medications prior to visit.     No Known Allergies  ROS Review of Systems Review of Systems  Constitutional: Negative for activity change, appetite change, chills and fever.  HENT: Negative for congestion, nosebleeds, trouble swallowing and voice change.   Respiratory:  Negative for cough, shortness of breath and wheezing.   Gastrointestinal: Negative for diarrhea, nausea and vomiting.  Genitourinary: Negative for difficulty urinating, dysuria, flank pain and hematuria.  Musculoskeletal: Negative for back pain, joint swelling and neck pain.  Neurological: Negative for dizziness, speech difficulty, light-headedness and numbness.  See HPI. All other review of systems negative.     Objective:    Physical Exam  BP 128/83 (BP Location: Right Arm, Patient Position: Sitting, Cuff Size: Normal)   Pulse 75   Temp 98.4 F (36.9 C) (Oral)   Resp 17   Ht 6' 1.62" (1.87 m)   Wt 267 lb 6.4 oz (121.3 kg)   SpO2 99%   BMI 34.69 kg/m  Wt Readings from Last 3 Encounters:  09/21/18 267 lb 6.4 oz (121.3 kg)  08/03/18 264 lb 6.4 oz (119.9 kg)  07/28/18 264 lb 9.6 oz (120 kg)   BP Readings from Last 3  Encounters:  09/21/18 128/83  08/03/18 (!) 142/89  07/28/18 134/86   Physical Exam  Constitutional: Oriented to person, place, and time. Appears well-developed and well-nourished.  HENT:  Head: Normocephalic and atraumatic.  Eyes: Conjunctivae and EOM are normal.  Cardiovascular: Normal rate, regular rhythm, normal heart sounds and intact distal pulses.  No murmur heard. Pulmonary/Chest: Effort normal and breath sounds normal. No stridor. No respiratory distress. Has no wheezes.  Neurological: Is alert and oriented to person, place, and time.  Skin: Skin is warm. Capillary refill takes less than 2 seconds.  Psychiatric: Has a normal mood and affect. Behavior is normal. Judgment and thought content normal.    Health Maintenance Due  Topic Date Due  . OPHTHALMOLOGY EXAM  05/04/1987  . HIV Screening  05/03/1992  . TETANUS/TDAP  05/03/1996    There are no preventive care reminders to display for this patient.  No results found for: TSH Lab Results  Component Value Date   WBC 4.9 07/14/2015   HGB 14.5 07/14/2015   HCT 43.2 07/14/2015   MCV 83.7 07/14/2015   PLT 230 07/14/2015   Lab Results  Component Value Date   NA 141 09/21/2018   K 4.7 09/21/2018   CO2 25 09/21/2018   GLUCOSE 120 (H) 09/21/2018   BUN 16 09/21/2018   CREATININE 0.87 09/21/2018   BILITOT 0.5 09/21/2018   ALKPHOS 56 09/21/2018   AST 20 09/21/2018   ALT 24 09/21/2018   PROT 7.2 09/21/2018   ALBUMIN 4.8 09/21/2018   CALCIUM 9.9 09/21/2018   ANIONGAP 8 07/14/2015   Lab Results  Component Value Date   CHOL 136 09/21/2018   Lab Results  Component Value Date   HDL 24 (L) 09/21/2018   Lab Results  Component Value Date   LDLCALC 76 09/21/2018   Lab Results  Component Value Date   TRIG 181 (H) 09/21/2018   Lab Results  Component Value Date   CHOLHDL 5.7 (H) 09/21/2018   Lab Results  Component Value Date   HGBA1C 7.4 (A) 09/21/2018      Assessment & Plan:   Problem List Items  Addressed This Visit      Endocrine   Type 2 diabetes mellitus with other specified complication ([place]) - Primary -  Diabetes improved    Relevant Orders   POCT glucose (manual entry) (Completed)   HM Diabetes Foot Exam (Completed)   Ambulatory referral to diabetic education   POCT glycosylated hemoglobin (Hb A1C) (Completed)   Lipid panel (Completed)   CMP14+EGFR (Completed)     Other  Class 1 obesity due to excess calories with serious comorbidity and body mass index (BMI) of 33.0 to 33.9 in adult  -  Advised continue weight tracking and exercise -  Gave him number for nutrition and diabetic education      A total of 30 minutes were spent face-to-face with the patient during this encounter and over half of that time was spent on counseling and coordination of care.  No orders of the defined types were placed in this encounter.   Follow-up: Return in about 3 months (around 12/21/2018) for diabetes.    Forrest Moron, MD

## 2018-09-22 LAB — CMP14+EGFR
A/G RATIO: 2 (ref 1.2–2.2)
ALT: 24 IU/L (ref 0–44)
AST: 20 IU/L (ref 0–40)
Albumin: 4.8 g/dL (ref 3.5–5.5)
Alkaline Phosphatase: 56 IU/L (ref 39–117)
BILIRUBIN TOTAL: 0.5 mg/dL (ref 0.0–1.2)
BUN/Creatinine Ratio: 18 (ref 9–20)
BUN: 16 mg/dL (ref 6–24)
CHLORIDE: 101 mmol/L (ref 96–106)
CO2: 25 mmol/L (ref 20–29)
Calcium: 9.9 mg/dL (ref 8.7–10.2)
Creatinine, Ser: 0.87 mg/dL (ref 0.76–1.27)
GFR calc Af Amer: 124 mL/min/{1.73_m2} (ref >59)
GFR calc non Af Amer: 107 mL/min/{1.73_m2} (ref >59)
Globulin, Total: 2.4 g/dL (ref 1.5–4.5)
Glucose: 120 mg/dL — ABNORMAL HIGH (ref 65–99)
POTASSIUM: 4.7 mmol/L (ref 3.5–5.2)
Sodium: 141 mmol/L (ref 134–144)
Total Protein: 7.2 g/dL (ref 6.0–8.5)

## 2018-09-22 LAB — LIPID PANEL
CHOL/HDL RATIO: 5.7 ratio — AB (ref 0.0–5.0)
Cholesterol, Total: 136 mg/dL (ref 100–199)
HDL: 24 mg/dL — ABNORMAL LOW (ref >39)
LDL CALC: 76 mg/dL (ref 0–99)
TRIGLYCERIDES: 181 mg/dL — AB (ref 0–149)
VLDL Cholesterol Cal: 36 mg/dL (ref 5–40)

## 2018-10-12 ENCOUNTER — Ambulatory Visit: Payer: Managed Care, Other (non HMO) | Admitting: Registered"

## 2018-10-27 ENCOUNTER — Telehealth: Payer: Self-pay | Admitting: Family Medicine

## 2018-10-27 NOTE — Telephone Encounter (Signed)
LVM for pt to call the office and reschedule their appt that was scheduled for 12/14/18 with Dr. Stallings. Due to a provider being out of the office, pt will need to reschedule. When pt calls back, please reschedule at their convenience. Thank you! °

## 2018-12-14 ENCOUNTER — Ambulatory Visit: Payer: 59 | Admitting: Family Medicine

## 2018-12-19 ENCOUNTER — Other Ambulatory Visit: Payer: Self-pay | Admitting: *Deleted

## 2018-12-19 DIAGNOSIS — E1169 Type 2 diabetes mellitus with other specified complication: Secondary | ICD-10-CM

## 2018-12-25 ENCOUNTER — Telehealth (INDEPENDENT_AMBULATORY_CARE_PROVIDER_SITE_OTHER): Payer: 59 | Admitting: Family Medicine

## 2018-12-25 ENCOUNTER — Other Ambulatory Visit: Payer: Self-pay

## 2018-12-25 DIAGNOSIS — E6609 Other obesity due to excess calories: Secondary | ICD-10-CM | POA: Diagnosis not present

## 2018-12-25 DIAGNOSIS — Z760 Encounter for issue of repeat prescription: Secondary | ICD-10-CM | POA: Diagnosis not present

## 2018-12-25 DIAGNOSIS — E1169 Type 2 diabetes mellitus with other specified complication: Secondary | ICD-10-CM | POA: Diagnosis not present

## 2018-12-25 DIAGNOSIS — Z6833 Body mass index (BMI) 33.0-33.9, adult: Secondary | ICD-10-CM

## 2018-12-25 MED ORDER — METFORMIN HCL ER 500 MG PO TB24: 500.0000 mg | ORAL_TABLET | Freq: Two times a day (BID) | ORAL | 3 refills | Status: DC

## 2018-12-25 NOTE — Progress Notes (Signed)
CC: 3 month f/u diabetes.  Per pt he needs refill on metformin but no other concerns.  Pt is in [place], [place] at this time but has not traveled outside the country in the last 3 weeks.  No travel to [place], [place], [address], [place] or [place]. No falls and no depression.

## 2018-12-25 NOTE — Patient Instructions (Signed)
° ° ° °  If you have lab work done today you will be contacted with your lab results within the next 2 weeks.  If you have not heard from us then please contact us. The fastest way to get your results is to register for My Chart. ° ° °IF you received an x-ray today, you will receive an invoice from [facility]. Please contact [facility] at [phone] with questions or concerns regarding your invoice.  ° °IF you received labwork today, you will receive an invoice from [facility]. Please contact [facility] at [phone] with questions or concerns regarding your invoice.  ° °Our billing staff will not be able to assist you with questions regarding bills from these companies. ° °You will be contacted with the lab results as soon as they are available. The fastest way to get your results is to activate your My Chart account. Instructions are located on the last page of this paperwork. If you have not heard from us regarding the results in 2 weeks, please contact this office. °  ° ° ° °

## 2018-12-25 NOTE — Progress Notes (Signed)
Telemedicine Encounter- SOAP NOTE Established Patient  This telephone encounter was conducted with the patient's (or proxy's) verbal consent via audio telecommunications: yes/no: Yes Patient was instructed to have this encounter in a suitably private space; and to only have persons present to whom they give permission to participate. In addition, patient identity was confirmed by use of name plus two identifiers (DOB and address).  I discussed the limitations, risks, security and privacy concerns of performing an evaluation and management service by telephone and the availability of in person appointments. I also discussed with the patient that there may be a patient responsible charge related to this service. The patient expressed understanding and agreed to proceed.  I spent a total of TIME; 0 MIN TO 60 MIN: 20 minutes talking with the patient or their proxy.  No chief complaint on file.   Subjective   James Hudson is a 43 y.o. established patient. 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 Type 2 diabetes mellitus with other specified complication (HCC) 07/28/2018  . Class 1 obesity due to excess calories with serious comorbidity and body mass index (BMI) of 33.0 to 33.9 in adult 07/28/2018    No past medical history on file.  Current Outpatient Medications  Medication Sig Dispense Refill  . metFORMIN (GLUCOPHAGE XR) 500 MG 24 hr tablet Take 1 tablet (500 mg total) by mouth 2 (two) times daily at 8 am and 10  pm. 60 tablet 3   No current facility-administered medications for this visit.     No Known Allergies  Social History   Socioeconomic History  . Marital status: Married    Spouse name: Not on file  . Number of children: Not on file  . Years of education: Not on file  . Highest education level: Not on file  Occupational History  . Not on file  Social Needs  . Financial resource strain: Not on file  . Food insecurity:    Worry: Not on file    Inability: Not on file  . Transportation needs:    Medical: Not on file    Non-medical: Not on file  Tobacco Use  . Smoking status: Never Smoker  . Smokeless tobacco: Never Used  Substance and Sexual Activity  . Alcohol use: No  . Drug use: No  . Sexual activity: Not on file  Lifestyle  . Physical activity:    Days per week: Not on file    Minutes per session: Not on file  . Stress: Not on file  Relationships  . Social connections:    Talks on phone: Not on file    Gets together: Not on file    Attends religious service: Not on file    Active member of club or organization: Not on file    Attends meetings of clubs or organizations: Not on file    Relationship status: Not on file  . Intimate partner violence:    Fear of  current or ex partner: Not on file    Emotionally abused: Not on file    Physically abused: Not on file    Forced sexual activity: Not on file  Other Topics Concern  . Not on file  Social History Narrative  . Not on file    ROS  Objective   Vitals as reported by the patient: There were no vitals filed for this visit.  Diagnoses and all orders for this visit:  Type 2 diabetes mellitus with other specified complication, without long-term current use of insulin (HCC)  Class 1 obesity due to excess calories with serious comorbidity and body mass index (BMI) of 33.0 to 33.9 in adult    Patient advised to jump rope and do jumping jacks to continue to burn calories Discussed his blood glucose and will  have patient stop in for labs He should also continue to drink plenty of water Work on weight loss  Discussed s/sx of hypoglycemia and how to manage this when on the road driving His compliance has been good    I discussed the assessment and treatment plan with the patient. The patient was provided an opportunity to ask questions and all were answered. The patient agreed with the plan and demonstrated an understanding of the instructions.   The patient was advised to call back or seek an in-person evaluation if the symptoms worsen or if the condition fails to improve as anticipated.  I provided 20 minutes of non-face-to-face time during this encounter.  Doristine Bosworth, MD  Primary Care at [facility]

## 2019-01-08 ENCOUNTER — Ambulatory Visit: Payer: 59

## 2019-06-07 ENCOUNTER — Other Ambulatory Visit: Payer: Self-pay | Admitting: Family Medicine

## 2019-06-07 DIAGNOSIS — Z760 Encounter for issue of repeat prescription: Secondary | ICD-10-CM

## 2019-06-08 NOTE — Telephone Encounter (Signed)
Requested Prescriptions  Pending Prescriptions Disp Refills  . metFORMIN (GLUCOPHAGE-XR) 500 MG 24 hr tablet [Pharmacy Med Name: METFORMIN HCL ER 500 MG TABLET] 60 tablet 0    Sig: TAKE 1 TABLET (500 MG TOTAL) BY MOUTH 2 (TWO) TIMES DAILY AT 8 AM AND 10 PM.     Endocrinology:  Diabetes - Biguanides Failed - 06/07/2019 10:32 AM      Failed - HBA1C is between 0 and 7.9 and within 180 days    Hemoglobin A1C  Date Value Ref Range Status  09/21/2018 7.4 (A) 4.0 - 5.6 % Final         Passed - Cr in normal range and within 360 days    Creatinine, Ser  Date Value Ref Range Status  09/21/2018 0.87 0.76 - 1.27 mg/dL Final         Passed - eGFR in normal range and within 360 days    GFR calc Af Amer  Date Value Ref Range Status  09/21/2018 124 >59 mL/min/1.73 Final   GFR calc non Af Amer  Date Value Ref Range Status  09/21/2018 107 >59 mL/min/1.73 Final         Passed - Valid encounter within last 6 months    Recent Outpatient Visits          5 months ago Type 2 diabetes mellitus with other specified complication, without long-term current use of insulin ([place])   Primary Care at [facility], Zoe A, MD   8 months ago Type 2 diabetes mellitus with other specified complication, unspecified whether long term insulin use ([place])   Primary Care at [facility], Arlie Solomons, MD   10 months ago 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  Asked him to call PCP office to schedule.  Patient agreeable.   Requested Prescriptions  Pending Prescriptions Disp Refills  .  metFORMIN (GLUCOPHAGE-XR) 500 MG 24 hr tablet [Pharmacy Med Name: METFORMIN HCL ER 500 MG TABLET] 60 tablet 0    Sig: TAKE 1 TABLET (500 MG TOTAL) BY MOUTH 2 (TWO) TIMES DAILY AT 8 AM AND 10 PM.     Endocrinology:  Diabetes - Biguanides Failed - 06/07/2019 10:32 AM      Failed - HBA1C is between 0 and 7.9 and within 180 days    Hemoglobin A1C  Date Value Ref Range Status  09/21/2018 7.4 (A) 4.0 - 5.6 % Final         Passed - Cr in normal range and within 360 days    Creatinine, Ser  Date Value Ref Range Status  09/21/2018 0.87 0.76 - 1.27 mg/dL Final         Passed - eGFR in normal range and within 360 days    GFR calc Af Amer  Date Value Ref Range Status  09/21/2018 124 >59 mL/min/1.73 Final   GFR calc non Af Amer  Date Value Ref Range Status  09/21/2018 107 >59 mL/min/1.73 Final         Passed -  Valid encounter within last 6 months    Recent Outpatient Visits          5 months ago Type 2 diabetes mellitus with other specified complication, without long-term current use of insulin ([place])   Primary Care at [facility], Zoe A, MD   8 months ago Type 2 diabetes mellitus with other specified complication, unspecified whether long term insulin use ([place])   Primary Care at [facility], Arlie Solomons, MD   10 months ago 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

## 2019-06-22 ENCOUNTER — Other Ambulatory Visit: Payer: Self-pay | Admitting: Family Medicine

## 2019-06-22 DIAGNOSIS — Z760 Encounter for issue of repeat prescription: Secondary | ICD-10-CM

## 2019-06-22 NOTE — Telephone Encounter (Signed)
Requested medication (s) are due for refill today: yes  Requested medication (s) are on the active medication list: yes  Last refill:  06/07/2019  Future visit scheduled: no  Notes to clinic: requesting 90 day supply   Requested Prescriptions  Pending Prescriptions Disp Refills   metFORMIN (GLUCOPHAGE-XR) 500 MG 24 hr tablet [Pharmacy Med Name: METFORMIN HCL ER 500 MG TABLET] 180 tablet 1    Sig: TAKE 1 TABLET (500 MG TOTAL) BY MOUTH 2 (TWO) TIMES DAILY AT 8 AM AND 10 PM.     Endocrinology:  Diabetes - Biguanides Failed - 06/22/2019 12:04 PM      Failed - HBA1C is between 0 and 7.9 and within 180 days    Hemoglobin A1C  Date Value Ref Range Status  09/21/2018 7.4 (A) 4.0 - 5.6 % Final         Passed - Cr in normal range and within 360 days    Creatinine, Ser  Date Value Ref Range Status  09/21/2018 0.87 0.76 - 1.27 mg/dL Final         Passed - eGFR in normal range and within 360 days    GFR calc Af Amer  Date Value Ref Range Status  09/21/2018 124 >59 mL/min/1.73 Final   GFR calc non Af Amer  Date Value Ref Range Status  09/21/2018 107 >59 mL/min/1.73 Final         Passed - Valid encounter within last 6 months    Recent Outpatient Visits          5 months ago Type 2 diabetes mellitus with other specified complication, without long-term current use of insulin ([place])   Primary Care at [facility], Zoe A, MD   9 months ago Type 2 diabetes mellitus with other specified complication, unspecified whether long term insulin use ([place])   Primary Care at [facility], Arlie Solomons, MD   10 months ago 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

## 2019-07-03 ENCOUNTER — Other Ambulatory Visit: Payer: Self-pay | Admitting: Family Medicine

## 2019-07-03 DIAGNOSIS — Z760 Encounter for issue of repeat prescription: Secondary | ICD-10-CM

## 2019-07-03 NOTE — Telephone Encounter (Signed)
Forwarding medication refill request to the clinical pool for review. 

## 2019-07-08 NOTE — Telephone Encounter (Signed)
Approved metformin xr 500 mg #60 with 0 refills approved.  Spoke with pt scheduled virtual visit w/stallings for 07/19/2019 at 4:40 pm and nurse visit for labs on 07/16/2019 at 9 am.  Advised will only give 30 day supply to cover until appt. Advised important to keep appts (lab) as this will determine if medication dosage will need to be adjusted or not.  Pt agreeable.

## 2019-07-16 ENCOUNTER — Ambulatory Visit: Payer: Self-pay

## 2019-07-19 ENCOUNTER — Telehealth (INDEPENDENT_AMBULATORY_CARE_PROVIDER_SITE_OTHER): Payer: 59 | Admitting: Family Medicine

## 2019-07-19 ENCOUNTER — Other Ambulatory Visit: Payer: Self-pay

## 2019-07-19 DIAGNOSIS — E6609 Other obesity due to excess calories: Secondary | ICD-10-CM | POA: Diagnosis not present

## 2019-07-19 DIAGNOSIS — Z114 Encounter for screening for human immunodeficiency virus [HIV]: Secondary | ICD-10-CM

## 2019-07-19 DIAGNOSIS — Z6833 Body mass index (BMI) 33.0-33.9, adult: Secondary | ICD-10-CM

## 2019-07-19 DIAGNOSIS — E1165 Type 2 diabetes mellitus with hyperglycemia: Secondary | ICD-10-CM | POA: Diagnosis not present

## 2019-07-19 DIAGNOSIS — Z760 Encounter for issue of repeat prescription: Secondary | ICD-10-CM | POA: Diagnosis not present

## 2019-07-19 NOTE — Progress Notes (Signed)
Telemedicine Encounter- SOAP NOTE Established Patient  This telephone encounter was conducted with the patient's (or proxy's) verbal consent via audio telecommunications: yes/no: Yes Patient was instructed to have this encounter in a suitably private space; and to only have persons present to whom they give permission to participate. In addition, patient identity was confirmed by use of name plus two identifiers (DOB and address).  I discussed the limitations, risks, security and privacy concerns of performing an evaluation and management service by telephone and the availability of in person appointments. I also discussed with the patient that there may be a patient responsible charge related to this service. The patient expressed understanding and agreed to proceed.  I spent a total of TIME; 0 MIN TO 60 MIN: 20 minutes talking with the patient or their proxy.  No chief complaint on file.   Subjective   James Hudson is a 43 y.o. established patient. Telephone visit today for  HPI  Diabetes Mellitus with hyperglycemia He reports that he is not sticking to a diabetic diet He sometimes forgets the metformin He is not urinating as frequently He denies frequent dry mouth He gets dry mouth if he talks for long periods of time He denies any exercise  Obesity He reports that he has gained some weight He is not exercising  He feels heavier He is not checking his sugars Depression screen [facility] 2/9 07/19/2019 12/25/2018 09/21/2018 08/03/2018 07/28/2018  Decreased Interest 0 0 0 0 0  Down, Depressed, Hopeless 0 0 0 0 0  PHQ - 2 Score 0 0 0 0 0      Patient Active Problem List   Diagnosis Date Noted  . Type 2 diabetes mellitus with other specified complication ([place]) 56/43/3295  . Class 1 obesity due to excess calories with serious comorbidity and body mass index (BMI) of 33.0 to 33.9 in adult 07/28/2018    No past medical history on file.  Current Outpatient Medications   Medication Sig Dispense Refill  . metFORMIN (GLUCOPHAGE-XR) 500 MG 24 hr tablet TAKE 1 TABLET (500 MG TOTAL) BY MOUTH 2 (TWO) TIMES DAILY AT 8 AM AND 10 PM. 60 tablet 0   No current facility-administered medications for this visit.     No Known Allergies  Social History   Socioeconomic History  . Marital status: Married    Spouse name: Not on file  . Number of children: Not on file  . Years of education: Not on file  . Highest education level: Not on file  Occupational History  . Not on file  Social Needs  . Financial resource strain: Not on file  . Food insecurity    Worry: Not on file    Inability: Not on file  . Transportation needs    Medical: Not on file    Non-medical: Not on file  Tobacco Use  . Smoking status: Never Smoker  . Smokeless tobacco: Never Used  Substance and Sexual Activity  . Alcohol use: No  . Drug use: No  . Sexual activity: Not on file  Lifestyle  . Physical activity    Days per week: Not on file    Minutes per session: Not on file  . Stress: Not on file  Relationships  . Social Herbalist on phone: Not on file    Gets together: Not on file    Attends religious service: Not on file    Active member of club or organization: Not on file  Attends meetings of clubs or organizations: Not on file    Relationship status: Not on file  . Intimate partner violence    Fear of current or ex partner: Not on file    Emotionally abused: Not on file    Physically abused: Not on file    Forced sexual activity: Not on file  Other Topics Concern  . Not on file  Social History Narrative  . Not on file    ROS Review of Systems  Constitutional: Negative for activity change, appetite change, chills and fever.  HENT: Negative for congestion, nosebleeds, trouble swallowing and voice change.   Respiratory: Negative for cough, shortness of breath and wheezing.   Gastrointestinal: Negative for diarrhea, nausea and vomiting.  Genitourinary:  Negative for difficulty urinating, dysuria, flank pain and hematuria.  Musculoskeletal: Negative for back pain, joint swelling and neck pain.  Neurological: Negative for dizziness, speech difficulty, light-headedness and numbness.  See HPI. All other review of systems negative.   Objective   Vitals as reported by the patient: There were no vitals filed for this visit.  Exam: alert and oriented Normal pulmonary effort   Diagnoses and all orders for this visit:  Type 2 diabetes mellitus with hyperglycemia, without long-term current use of insulin (HCC) Likely pt will have a1c out of range since pt is currently gaining weight and not exercising He is also only 40-50% compliant with metformin Will await results -     Ambulatory referral to Ophthalmology -     HIV Antibody (routine testing w rflx); Future -     Microalbumin, urine; Future  Class 1 obesity due to excess calories with serious comorbidity and body mass index (BMI) of 33.0 to 33.9 in adult -  Advised pt to resume exercise for 20 mins a day  Encounter for medication refill - discussed metformin and monitoring blood glucose  Screening for HIV (human immunodeficiency virus) -     HIV Antibody (routine testing w rflx); Future     I discussed the assessment and treatment plan with the patient. The patient was provided an opportunity to ask questions and all were answered. The patient agreed with the plan and demonstrated an understanding of the instructions.   The patient was advised to call back or seek an in-person evaluation if the symptoms worsen or if the condition fails to improve as anticipated.  I provided 20 minutes of non-face-to-face time during this encounter.  Doristine Bosworth, MD  Primary Care at [facility]

## 2019-07-23 ENCOUNTER — Ambulatory Visit: Payer: Self-pay

## 2019-07-24 ENCOUNTER — Other Ambulatory Visit: Payer: Self-pay | Admitting: Family Medicine

## 2019-07-24 DIAGNOSIS — Z760 Encounter for issue of repeat prescription: Secondary | ICD-10-CM

## 2019-07-30 ENCOUNTER — Ambulatory Visit: Payer: 59

## 2019-10-26 LAB — HM DIABETES EYE EXAM

## 2019-11-02 ENCOUNTER — Other Ambulatory Visit: Payer: Self-pay

## 2019-11-02 DIAGNOSIS — E1165 Type 2 diabetes mellitus with hyperglycemia: Secondary | ICD-10-CM

## 2019-11-02 DIAGNOSIS — Z13220 Encounter for screening for lipoid disorders: Secondary | ICD-10-CM

## 2019-11-05 ENCOUNTER — Ambulatory Visit (INDEPENDENT_AMBULATORY_CARE_PROVIDER_SITE_OTHER): Payer: No Typology Code available for payment source | Admitting: Family Medicine

## 2019-11-05 ENCOUNTER — Other Ambulatory Visit: Payer: Self-pay

## 2019-11-05 DIAGNOSIS — Z13220 Encounter for screening for lipoid disorders: Secondary | ICD-10-CM

## 2019-11-05 DIAGNOSIS — Z114 Encounter for screening for human immunodeficiency virus [HIV]: Secondary | ICD-10-CM

## 2019-11-05 DIAGNOSIS — E1165 Type 2 diabetes mellitus with hyperglycemia: Secondary | ICD-10-CM

## 2019-11-06 LAB — LIPID PANEL
Chol/HDL Ratio: 6.9 ratio — ABNORMAL HIGH (ref 0.0–5.0)
Cholesterol, Total: 131 mg/dL (ref 100–199)
HDL: 19 mg/dL — ABNORMAL LOW (ref >39)
LDL Chol Calc (NIH): 37 mg/dL (ref 0–99)
Triglycerides: 534 mg/dL — ABNORMAL HIGH (ref 0–149)
VLDL Cholesterol Cal: 75 mg/dL — ABNORMAL HIGH (ref 5–40)

## 2019-11-06 LAB — COMPREHENSIVE METABOLIC PANEL
ALT: 30 IU/L (ref 0–44)
AST: 24 IU/L (ref 0–40)
Albumin/Globulin Ratio: 2.4 — ABNORMAL HIGH (ref 1.2–2.2)
Albumin: 4.8 g/dL (ref 4.0–5.0)
Alkaline Phosphatase: 68 IU/L (ref 39–117)
BUN/Creatinine Ratio: 16 (ref 9–20)
BUN: 14 mg/dL (ref 6–24)
Bilirubin Total: 0.4 mg/dL (ref 0.0–1.2)
CO2: 21 mmol/L (ref 20–29)
Calcium: 9.7 mg/dL (ref 8.7–10.2)
Chloride: 103 mmol/L (ref 96–106)
Creatinine, Ser: 0.9 mg/dL (ref 0.76–1.27)
GFR calc Af Amer: 121 mL/min/{1.73_m2} (ref >59)
GFR calc non Af Amer: 105 mL/min/{1.73_m2} (ref >59)
Globulin, Total: 2 g/dL (ref 1.5–4.5)
Glucose: 148 mg/dL — ABNORMAL HIGH (ref 65–99)
Potassium: 4.5 mmol/L (ref 3.5–5.2)
Sodium: 142 mmol/L (ref 134–144)
Total Protein: 6.8 g/dL (ref 6.0–8.5)

## 2019-11-06 LAB — HIV ANTIBODY (ROUTINE TESTING W REFLEX): HIV Screen 4th Generation wRfx: NONREACTIVE

## 2019-11-06 LAB — HEMOGLOBIN A1C
Est. average glucose Bld gHb Est-mCnc: 269 mg/dL
Hgb A1c MFr Bld: 11 % — ABNORMAL HIGH (ref 4.8–5.6)

## 2019-11-06 LAB — MICROALBUMIN, URINE: Microalbumin, Urine: 10.7 ug/mL

## 2019-11-07 NOTE — Progress Notes (Signed)
Established Patient Office Visit  Subjective:  Patient ID: James Hudson, male    DOB: 11-16-1975  Age: 44 y.o. MRN: [id number]  CC:  Chief Complaint  Patient presents with  . Diabetes    f/u     HPI James Hudson presents for   Obesity Wt Readings from Last 3 Encounters:  11/08/19 267 lb (121.1 kg)  09/21/18 267 lb 6.4 oz (121.3 kg)  08/03/18 264 lb 6.4 oz (119.9 kg)   Pt reports that he is eating on the road and eating fast food again He tries to eat better when he is at home He was doing subway but that was getting boring He states that he states that he was concerned about getting covid trying to find food   Diabetes Mellitus: Patient presents for follow up of diabetes. Symptoms: polyuria, craving sweets, increased appetite. Symptoms have stabilized. Patient denies hypoglycemia  and nausea.  Evaluation to date has been included: hemoglobin A1C.  Home sugars: patient does not check sugars.   Component     Latest Ref Rng & Units 06/16/2018 09/21/2018 11/05/2019  Hemoglobin A1C     4.8 - 5.6 % 8.7 (A) 7.4 (A) 11.0 (H)  Est. average glucose Bld gHb Est-mCnc     mg/dL   226    Dyslipidemia: Patient presents for evaluation of lipids.  Compliance with treatment thus far has been good.  A repeat fasting lipid profile was done.  The patient does not use medications that may worsen dyslipidemias (corticosteroids, progestins, anabolic steroids, diuretics, beta-blockers, amiodarone, cyclosporine, olanzapine). The patient exercises rarely.  The patient is not known to have coexisting coronary artery disease.   Lab Results  Component Value Date   CHOL 131 11/05/2019   CHOL 136 09/21/2018   CHOL 126 06/16/2018   Lab Results  Component Value Date   HDL 19 (L) 11/05/2019   HDL 24 (L) 09/21/2018   HDL 23 (L) 06/16/2018   Lab Results  Component Value Date   LDLCALC 37 11/05/2019   LDLCALC 76 09/21/2018   LDLCALC 50 06/16/2018   Lab Results  Component Value Date   TRIG 534 (H) 11/05/2019   TRIG 181 (H) 09/21/2018   TRIG 263 (H) 06/16/2018   Lab Results  Component Value Date   CHOLHDL 6.9 (H) 11/05/2019   CHOLHDL 5.7 (H) 09/21/2018   CHOLHDL 5.5 (H) 06/16/2018   No results found for: LDLDIRECT     History reviewed. No pertinent past medical history.  History reviewed. No pertinent surgical history.  History reviewed. No pertinent family history.  Social History   Socioeconomic History  . Marital status: Married    Spouse name: Not on file  . Number of children: Not on file  . Years of education: Not on file  . Highest education level: Not on file  Occupational History  . Not on file  Tobacco Use  . Smoking status: Never Smoker  . Smokeless tobacco: Never Used  Substance and Sexual Activity  . Alcohol use: No  . Drug use: No  . Sexual activity: Not on file  Other Topics Concern  . Not on file  Social History Narrative  . Not on file   Social Determinants of Health   Financial Resource Strain:   . Difficulty of Paying Living Expenses: Not on file  Food Insecurity:   . Worried About Programme researcher, broadcasting/film/video in the Last Year: Not on file  . Ran Out of Food in the Last Year:  Not on file  Transportation Needs:   . Lack of Transportation (Medical): Not on file  . Lack of Transportation (Non-Medical): Not on file  Physical Activity:   . Days of Exercise per Week: Not on file  . Minutes of Exercise per Session: Not on file  Stress:   . Feeling of Stress : Not on file  Social Connections:   . Frequency of Communication with Friends and Family: Not on file  . Frequency of Social Gatherings with Friends and Family: Not on file  . Attends Religious Services: Not on file  . Active Member of Clubs or Organizations: Not on file  . Attends Archivist Meetings: Not on file  . Marital Status: Not on file  Intimate Partner Violence:   . Fear of Current or Ex-Partner: Not on file  . Emotionally Abused: Not on file  .  Physically Abused: Not on file  . Sexually Abused: Not on file    Outpatient Medications Prior to Visit  Medication Sig Dispense Refill  . metFORMIN (GLUCOPHAGE-XR) 500 MG 24 hr tablet TAKE 1 TABLET (500 MG TOTAL) BY MOUTH 2 (TWO) TIMES DAILY AT 8 AM AND 10 PM. 60 tablet 0   No facility-administered medications prior to visit.    No Known Allergies  ROS Review of Systems Review of Systems  Constitutional: Negative for activity change, appetite change, chills and fever.  HENT: Negative for congestion, nosebleeds, trouble swallowing and voice change.   Respiratory: Negative for cough, shortness of breath and wheezing.   Gastrointestinal: Negative for diarrhea, nausea and vomiting.  Genitourinary: Negative for difficulty urinating, dysuria, flank pain and hematuria.  Musculoskeletal: Negative for back pain, joint swelling and neck pain.  Neurological: Negative for dizziness, speech difficulty, light-headedness and numbness.  See HPI. All other review of systems negative.     Objective:    Physical Exam  BP (!) 151/92   Pulse 89   Temp (!) 97.3 F (36.3 C) (Temporal)   Ht 6\' 1"  (1.854 m)   Wt 267 lb (121.1 kg)   SpO2 98%   BMI 35.23 kg/m  Wt Readings from Last 3 Encounters:  11/08/19 267 lb (121.1 kg)  09/21/18 267 lb 6.4 oz (121.3 kg)  08/03/18 264 lb 6.4 oz (119.9 kg)   Physical Exam  Constitutional: Oriented to person, place, and time. Appears well-developed and well-nourished.  HENT:  Head: Normocephalic and atraumatic.  Eyes: Conjunctivae and EOM are normal.  Cardiovascular: Normal rate, regular rhythm, normal heart sounds and intact distal pulses.  No murmur heard. Pulmonary/Chest: Effort normal and breath sounds normal. No stridor. No respiratory distress. Has no wheezes.  Neurological: Is alert and oriented to person, place, and time.  Skin: Skin is warm. Capillary refill takes less than 2 seconds.  Psychiatric: Has a normal mood and affect. Behavior is  normal. Judgment and thought content normal.    Health Maintenance Due  Topic Date Due  . OPHTHALMOLOGY EXAM  05/04/1987    There are no preventive care reminders to display for this patient.  No results found for: TSH Lab Results  Component Value Date   WBC 4.9 07/14/2015   HGB 14.5 07/14/2015   HCT 43.2 07/14/2015   MCV 83.7 07/14/2015   PLT 230 07/14/2015   Lab Results  Component Value Date   NA 142 11/05/2019   K 4.5 11/05/2019   CO2 21 11/05/2019   GLUCOSE 148 (H) 11/05/2019   BUN 14 11/05/2019   CREATININE 0.90 11/05/2019  BILITOT 0.4 11/05/2019   ALKPHOS 68 11/05/2019   AST 24 11/05/2019   ALT 30 11/05/2019   PROT 6.8 11/05/2019   ALBUMIN 4.8 11/05/2019   CALCIUM 9.7 11/05/2019   ANIONGAP 8 07/14/2015   Lab Results  Component Value Date   CHOL 131 11/05/2019   Lab Results  Component Value Date   HDL 19 (L) 11/05/2019   Lab Results  Component Value Date   LDLCALC 37 11/05/2019   Lab Results  Component Value Date   TRIG 534 (H) 11/05/2019   Lab Results  Component Value Date   CHOLHDL 6.9 (H) 11/05/2019   Lab Results  Component Value Date   HGBA1C 11.0 (H) 11/05/2019      Assessment & Plan:   Problem List Items Addressed This Visit    None    Visit Diagnoses    Type 2 diabetes mellitus with hyperglycemia, without long-term current use of insulin (HCC)    -  Primary  hemoglobin a1c deteriorated Continue exercise Lipids monitored and renal function in range He should resume metformin On asa 81mg  Reviewed diabetic foot care Emphasized importance of eye and dental exam   Provided nutritional counseling.    Relevant Medications   metFORMIN (GLUCOPHAGE-XR) 500 MG 24 hr tablet   atorvastatin (LIPITOR) 10 MG tablet   Other Relevant Orders   POCT glucose (manual entry) (Completed)   Encounter for medication refill       Relevant Medications   metFORMIN (GLUCOPHAGE-XR) 500 MG 24 hr tablet   Hypertriglyceridemia    -  Discussed   Triglycerides Will advised welchol and also advised lipitor   Relevant Medications   colesevelam (WELCHOL) 625 MG tablet   atorvastatin (LIPITOR) 10 MG tablet      Meds ordered this encounter  Medications  . colesevelam (WELCHOL) 625 MG tablet    Sig: Take 1 tablet (625 mg total) by mouth 2 (two) times daily with a meal.    Dispense:  180 tablet    Refill:  1  . metFORMIN (GLUCOPHAGE-XR) 500 MG 24 hr tablet    Sig: Take 1 tablet (500 mg total) by mouth 2 (two) times daily at 8 am and 10 pm.    Dispense:  180 tablet    Refill:  1  . atorvastatin (LIPITOR) 10 MG tablet    Sig: Take 1 tablet (10 mg total) by mouth daily.    Dispense:  90 tablet    Refill:  1  . Continuous Blood Gluc Receiver (FREESTYLE LIBRE 14 DAY READER) DEVI    Sig: 1 Device by Does not apply route daily.    Dispense:  1 each    Refill:  0  . Continuous Blood Gluc Sensor (FREESTYLE LIBRE 14 DAY SENSOR) MISC    Sig: 1 Device by Does not apply route daily.    Dispense:  2 each    Refill:  11    Follow-up: Return in about 4 weeks (around 12/06/2019) for virtual visit in 4 weeks to go over sugars, in person visit in 3 months for diabetes.   A total of 25 minutes were spent face-to-face with the patient during this encounter and over half of that time was spent on counseling and coordination of care.  02/05/2020, MD

## 2019-11-08 ENCOUNTER — Ambulatory Visit (INDEPENDENT_AMBULATORY_CARE_PROVIDER_SITE_OTHER): Payer: No Typology Code available for payment source | Admitting: Family Medicine

## 2019-11-08 ENCOUNTER — Encounter: Payer: Self-pay | Admitting: Family Medicine

## 2019-11-08 ENCOUNTER — Other Ambulatory Visit: Payer: Self-pay

## 2019-11-08 VITALS — BP 151/92 | HR 89 | Temp 97.3°F | Ht 73.0 in | Wt 267.0 lb

## 2019-11-08 DIAGNOSIS — E1165 Type 2 diabetes mellitus with hyperglycemia: Secondary | ICD-10-CM

## 2019-11-08 DIAGNOSIS — E781 Pure hyperglyceridemia: Secondary | ICD-10-CM

## 2019-11-08 DIAGNOSIS — Z760 Encounter for issue of repeat prescription: Secondary | ICD-10-CM

## 2019-11-08 LAB — GLUCOSE, POCT (MANUAL RESULT ENTRY): POC Glucose: 294 mg/dl — AB (ref 70–99)

## 2019-11-08 MED ORDER — FREESTYLE LIBRE 14 DAY READER DEVI: 1.0000 | Freq: Every day | 0 refills | Status: DC

## 2019-11-08 MED ORDER — FREESTYLE LIBRE 14 DAY SENSOR MISC: 1.0000 | Freq: Every day | 11 refills | Status: DC

## 2019-11-08 MED ORDER — METFORMIN HCL ER 500 MG PO TB24: 500.0000 mg | ORAL_TABLET | Freq: Two times a day (BID) | ORAL | 1 refills | Status: DC

## 2019-11-08 MED ORDER — ATORVASTATIN CALCIUM 10 MG PO TABS: 10.0000 mg | ORAL_TABLET | Freq: Every day | ORAL | 1 refills | Status: DC

## 2019-11-08 MED ORDER — COLESEVELAM HCL 625 MG PO TABS: 625.0000 mg | ORAL_TABLET | Freq: Two times a day (BID) | ORAL | 1 refills | Status: DC

## 2019-11-08 NOTE — Patient Instructions (Addendum)
   To lower the sugars Take Welchol twice a day with the metformin Every night take Lipitor to help increase the good cholesterol and decrease the bad cholesterol This will decrease your risk of heart attack or stroke.    If you have lab work done today you will be contacted with your lab results within the next 2 weeks.  If you have not heard from [place] then please contact us. The fastest way to get your results is to register for My Chart.   IF you received an x-ray today, you will receive an invoice from [facility]. Please contact [facility] at [phone] with questions or concerns regarding your invoice.   IF you received labwork today, you will receive an invoice from [place]. Please contact [facility] at [phone] with questions or concerns regarding your invoice.   Our billing staff will not be able to assist you with questions regarding bills from these companies.  You will be contacted with the lab results as soon as they are available. The fastest way to get your results is to activate your My Chart account. Instructions are located on the last page of this paperwork. If you have not heard from [place] regarding the results in 2 weeks, please contact this office.

## 2019-12-06 ENCOUNTER — Encounter: Payer: Self-pay | Admitting: Family Medicine

## 2019-12-06 ENCOUNTER — Telehealth (INDEPENDENT_AMBULATORY_CARE_PROVIDER_SITE_OTHER): Payer: 59 | Admitting: Family Medicine

## 2019-12-06 VITALS — Ht 73.0 in | Wt 264.0 lb

## 2019-12-06 DIAGNOSIS — Z760 Encounter for issue of repeat prescription: Secondary | ICD-10-CM | POA: Diagnosis not present

## 2019-12-06 DIAGNOSIS — E1165 Type 2 diabetes mellitus with hyperglycemia: Secondary | ICD-10-CM | POA: Diagnosis not present

## 2019-12-06 DIAGNOSIS — E781 Pure hyperglyceridemia: Secondary | ICD-10-CM | POA: Diagnosis not present

## 2019-12-06 MED ORDER — FREESTYLE LIBRE 14 DAY READER DEVI: 1.0000 | Freq: Every day | 0 refills | Status: DC

## 2019-12-06 MED ORDER — METFORMIN HCL ER 500 MG PO TB24: 500.0000 mg | ORAL_TABLET | Freq: Two times a day (BID) | ORAL | 1 refills | Status: DC

## 2019-12-06 MED ORDER — FREESTYLE LIBRE 14 DAY SENSOR MISC: 1.0000 | Freq: Every day | 11 refills | Status: DC

## 2019-12-06 MED ORDER — ATORVASTATIN CALCIUM 10 MG PO TABS: 10.0000 mg | ORAL_TABLET | Freq: Every day | ORAL | 1 refills | Status: DC

## 2019-12-06 NOTE — Progress Notes (Signed)
Telemedicine Encounter- SOAP NOTE Established Patient  This telephone encounter was conducted with the patient's (or proxy's) verbal consent via audio telecommunications: yes/no: Yes Patient was instructed to have this encounter in a suitably private space; and to only have persons present to whom they give permission to participate. In addition, patient identity was confirmed by use of name plus two identifiers (DOB and address).  I discussed the limitations, risks, security and privacy concerns of performing an evaluation and management service by telephone and the availability of in person appointments. I also discussed with the patient that there may be a patient responsible charge related to this service. The patient expressed understanding and agreed to proceed.  I spent a total of TIME; 0 MIN TO 60 MIN: 15 minutes talking with the patient or their proxy.  Chief Complaint  Patient presents with  . Diabetes    f/u- med refills needed     Subjective   James Hudson is a 44 y.o. established patient. Telephone visit today for  HPI   Diabetes Mellitus:   Lab Results  Component Value Date   HGBA1C 11.0 (H) 11/05/2019   Patient reports that he is taking the metformin He is exercising He reports that the meter was not a the [facility] His meds needed to go to [facility] He DENIES nausea, vomiting or upset stomach.  Lab Results  Component Value Date   HGBA1C 11.0 (H) 11/05/2019   Wt Readings from Last 3 Encounters:  12/06/19 264 lb (119.7 kg)  11/08/19 267 lb (121.1 kg)  09/21/18 267 lb 6.4 oz (121.3 kg)    Patient Active Problem List   Diagnosis Date Noted  . Type 2 diabetes mellitus with other specified complication ([place]) 69/48/5462  . Class 1 obesity due to excess calories with serious comorbidity and body mass index (BMI) of 33.0 to 33.9 in adult 07/28/2018    No past medical history on file.  Current Outpatient Medications  Medication Sig Dispense Refill  .  metFORMIN (GLUCOPHAGE-XR) 500 MG 24 hr tablet Take 1 tablet (500 mg total) by mouth 2 (two) times daily at 8 am and 10 pm. 180 tablet 1  . atorvastatin (LIPITOR) 10 MG tablet Take 1 tablet (10 mg total) by mouth daily. (Patient not taking: Reported on 12/06/2019) 90 tablet 1  . colesevelam (WELCHOL) 625 MG tablet Take 1 tablet (625 mg total) by mouth 2 (two) times daily with a meal. (Patient not taking: Reported on 12/06/2019) 180 tablet 1  . Continuous Blood Gluc Receiver (FREESTYLE LIBRE 14 DAY READER) DEVI 1 Device by Does not apply route daily. (Patient not taking: Reported on 12/06/2019) 1 each 0  . Continuous Blood Gluc Sensor (FREESTYLE LIBRE 14 DAY SENSOR) MISC 1 Device by Does not apply route daily. (Patient not taking: Reported on 12/06/2019) 2 each 11   No current facility-administered medications for this visit.    No Known Allergies  Social History   Socioeconomic History  . Marital status: Married    Spouse name: Not on file  . Number of children: Not on file  . Years of education: Not on file  . Highest education level: Not on file  Occupational History  . Not on file  Tobacco Use  . Smoking status: Never Smoker  . Smokeless tobacco: Never Used  Substance and Sexual Activity  . Alcohol use: No  . Drug use: No  . Sexual activity: Not on file  Other Topics Concern  . Not on file  Social History Narrative  . Not on file   Social Determinants of Health   Financial Resource Strain:   . Difficulty of Paying Living Expenses: Not on file  Food Insecurity:   . Worried About Programme researcher, broadcasting/film/video in the Last Year: Not on file  . Ran Out of Food in the Last Year: Not on file  Transportation Needs:   . Lack of Transportation (Medical): Not on file  . Lack of Transportation (Non-Medical): Not on file  Physical Activity:   . Days of Exercise per Week: Not on file  . Minutes of Exercise per Session: Not on file  Stress:   . Feeling of Stress : Not on file  Social Connections:     . Frequency of Communication with Friends and Family: Not on file  . Frequency of Social Gatherings with Friends and Family: Not on file  . Attends Religious Services: Not on file  . Active Member of Clubs or Organizations: Not on file  . Attends Banker Meetings: Not on file  . Marital Status: Not on file  Intimate Partner Violence:   . Fear of Current or Ex-Partner: Not on file  . Emotionally Abused: Not on file  . Physically Abused: Not on file  . Sexually Abused: Not on file    ROS See hpi   Objective   Vitals as reported by the patient: Today's Vitals   12/06/19 1053  Weight: 264 lb (119.7 kg)  Height: 6\' 1"  (1.854 m)   Unable to do exam due to phone visit  James Hudson was seen today for diabetes.  Diagnoses and all orders for this visit:  Encounter for medication refill  Type 2 diabetes mellitus with hyperglycemia, without long-term current use of insulin (HCC)  Hypertriglyceridemia   Improved compliance Will have nurse visit in 2 weeks for labs  I discussed the assessment and treatment plan with the patient. The patient was provided an opportunity to ask questions and all were answered. The patient agreed with the plan and demonstrated an understanding of the instructions.   The patient was advised to call back or seek an in-person evaluation if the symptoms worsen or if the condition fails to improve as anticipated.  I provided 15 minutes of non-face-to-face time during this encounter.  Thereasa Distance, MD  Primary Care at [facility]

## 2019-12-06 NOTE — Patient Instructions (Signed)
° ° ° °  If you have lab work done today you will be contacted with your lab results within the next 2 weeks.  If you have not heard from us then please contact us. The fastest way to get your results is to register for My Chart. ° ° °IF you received an x-ray today, you will receive an invoice from [facility]. Please contact [facility] at [phone] with questions or concerns regarding your invoice.  ° °IF you received labwork today, you will receive an invoice from [facility]. Please contact [facility] at [phone] with questions or concerns regarding your invoice.  ° °Our billing staff will not be able to assist you with questions regarding bills from these companies. ° °You will be contacted with the lab results as soon as they are available. The fastest way to get your results is to activate your My Chart account. Instructions are located on the last page of this paperwork. If you have not heard from us regarding the results in 2 weeks, please contact this office. °  ° ° ° °

## 2020-02-07 ENCOUNTER — Ambulatory Visit (INDEPENDENT_AMBULATORY_CARE_PROVIDER_SITE_OTHER): Payer: 59 | Admitting: Family Medicine

## 2020-02-07 ENCOUNTER — Encounter: Payer: Self-pay | Admitting: Family Medicine

## 2020-02-07 ENCOUNTER — Other Ambulatory Visit: Payer: Self-pay

## 2020-02-07 VITALS — BP 122/81 | HR 77 | Temp 98.6°F | Ht 73.0 in | Wt 261.4 lb

## 2020-02-07 DIAGNOSIS — Z760 Encounter for issue of repeat prescription: Secondary | ICD-10-CM | POA: Diagnosis not present

## 2020-02-07 DIAGNOSIS — E1165 Type 2 diabetes mellitus with hyperglycemia: Secondary | ICD-10-CM

## 2020-02-07 DIAGNOSIS — E785 Hyperlipidemia, unspecified: Secondary | ICD-10-CM | POA: Insufficient documentation

## 2020-02-07 DIAGNOSIS — B360 Pityriasis versicolor: Secondary | ICD-10-CM | POA: Diagnosis not present

## 2020-02-07 DIAGNOSIS — E781 Pure hyperglyceridemia: Secondary | ICD-10-CM

## 2020-02-07 LAB — POCT GLYCOSYLATED HEMOGLOBIN (HGB A1C): Hemoglobin A1C: 8.8 % — AB (ref 4.0–5.6)

## 2020-02-07 LAB — GLUCOSE, POCT (MANUAL RESULT ENTRY): POC Glucose: 123 mg/dl — AB (ref 70–99)

## 2020-02-07 MED ORDER — KETOCONAZOLE 2 % EX SHAM: 1.0000 "application " | MEDICATED_SHAMPOO | CUTANEOUS | 1 refills | Status: DC

## 2020-02-07 MED ORDER — FLUCONAZOLE 150 MG PO TABS: ORAL_TABLET | ORAL | 0 refills | Status: DC

## 2020-02-07 NOTE — Progress Notes (Signed)
Established Patient Office Visit  Subjective:  Patient ID: James Hudson, male    DOB: 1976/02/11  Age: 44 y.o. MRN: [id number]  CC:  Chief Complaint  Patient presents with  . Diabetes    3 month f/u    HPI TABIUS ROOD presents for   Diabetes Mellitus: Patient presents for follow up of diabetes. Symptoms: weight loss. Symptoms have stabilized. Patient denies foot ulcerations, hypoglycemia , nausea, polydipsia, polyuria, visual disturbances and vomitting.  Evaluation to date has been included: hemoglobin A1C.  Home sugars: BGs are running  consistent with Hgb A1C. Treatment to date: Continued metformin which has been effective.  His wife was taking some of his metformin. Lab Results  Component Value Date   HGBA1C 11.0 (H) 11/05/2019   Lab Results  Component Value Date   HGBA1C 8.8 (A) 02/07/2020    Obesity He is walking for exercise. He is not eating junk foods.  He states that he feels like he is having more energy.  Body mass index is 34.49 kg/m. Wt Readings from Last 3 Encounters:  02/07/20 261 lb 6.4 oz (118.6 kg)  12/06/19 264 lb (119.7 kg)  11/08/19 267 lb (121.1 kg)    Hypertriglyceridemia Patient states that the pharmacy did not dispense the welchol.  He states that he is taking the lipitor 68m.  He is eating much better.  He does not feel like he is eating anything like what he was eating his foods from before his diabetes diagnosis.  Lab Results  Component Value Date   CHOL 131 11/05/2019   CHOL 136 09/21/2018   CHOL 126 06/16/2018   Lab Results  Component Value Date   HDL 19 (L) 11/05/2019   HDL 24 (L) 09/21/2018   HDL 23 (L) 06/16/2018   Lab Results  Component Value Date   LDLCALC 37 11/05/2019   LDLCALC 76 09/21/2018   LDLCALC 50 06/16/2018   Lab Results  Component Value Date   TRIG 534 (H) 11/05/2019   TRIG 181 (H) 09/21/2018   TRIG 263 (H) 06/16/2018   Lab Results  Component Value Date   CHOLHDL 6.9 (H) 11/05/2019   CHOLHDL  5.7 (H) 09/21/2018   CHOLHDL 5.5 (H) 06/16/2018   No results found for: LDLDIRECT    No past medical history on file.  No past surgical history on file.  No family history on file.  Social History   Socioeconomic History  . Marital status: Married    Spouse name: Not on file  . Number of children: Not on file  . Years of education: Not on file  . Highest education level: Not on file  Occupational History  . Not on file  Tobacco Use  . Smoking status: Never Smoker  . Smokeless tobacco: Never Used  Substance and Sexual Activity  . Alcohol use: No  . Drug use: No  . Sexual activity: Not on file  Other Topics Concern  . Not on file  Social History Narrative  . Not on file   Social Determinants of Health   Financial Resource Strain:   . Difficulty of Paying Living Expenses:   Food Insecurity:   . Worried About RCharity fundraiserin the Last Year:   . RArboriculturistin the Last Year:   Transportation Needs:   . LFilm/video editor(Medical):   .Marland KitchenLack of Transportation (Non-Medical):   Physical Activity:   . Days of Exercise per Week:   . Minutes of  Exercise per Session:   Stress:   . Feeling of Stress :   Social Connections:   . Frequency of Communication with Friends and Family:   . Frequency of Social Gatherings with Friends and Family:   . Attends Religious Services:   . Active Member of Clubs or Organizations:   . Attends Club or Organization Meetings:   . Marital Status:   Intimate Partner Violence:   . Fear of Current or Ex-Partner:   . Emotionally Abused:   . Physically Abused:   . Sexually Abused:     Outpatient Medications Prior to Visit  Medication Sig Dispense Refill  . atorvastatin (LIPITOR) 10 MG tablet Take 1 tablet (10 mg total) by mouth daily. 90 tablet 1  . Continuous Blood Gluc Receiver (FREESTYLE LIBRE 14 DAY READER) DEVI 1 Device by Does not apply route daily. 1 each 0  . Continuous Blood Gluc Sensor (FREESTYLE LIBRE 14 DAY  SENSOR) MISC 1 Device by Does not apply route daily. 2 each 11  . metFORMIN (GLUCOPHAGE-XR) 500 MG 24 hr tablet Take 1 tablet (500 mg total) by mouth 2 (two) times daily at 8 am and 10 pm. 180 tablet 1  . colesevelam (WELCHOL) 625 MG tablet Take 1 tablet (625 mg total) by mouth 2 (two) times daily with a meal. 180 tablet 1   No facility-administered medications prior to visit.    No Known Allergies  ROS Review of Systems Review of Systems  Constitutional: Negative for activity change, appetite change, chills and fever.  HENT: Negative for congestion, nosebleeds, trouble swallowing and voice change.   Respiratory: Negative for cough, shortness of breath and wheezing.   Gastrointestinal: Negative for diarrhea, nausea and vomiting.  Genitourinary: Negative for difficulty urinating, dysuria, flank pain and hematuria.  Musculoskeletal: Negative for back pain, joint swelling and neck pain.  Neurological: Negative for dizziness, speech difficulty, light-headedness and numbness.  See HPI. All other review of systems negative.     Objective:    Physical Exam  BP 122/81 (BP Location: Right Arm, Patient Position: Sitting, Cuff Size: Large)   Pulse 77   Temp 98.6 F (37 C) (Temporal)   Ht 6' 1" (1.854 m)   Wt 261 lb 6.4 oz (118.6 kg)   SpO2 99%   BMI 34.49 kg/m  Wt Readings from Last 3 Encounters:  02/07/20 261 lb 6.4 oz (118.6 kg)  12/06/19 264 lb (119.7 kg)  11/08/19 267 lb (121.1 kg)   Physical Exam  Constitutional: Oriented to person, place, and time. Appears well-developed and well-nourished.  HENT:  Head: Normocephalic and atraumatic.  Eyes: Conjunctivae and EOM are normal.  Cardiovascular: Normal rate, regular rhythm, normal heart sounds and intact distal pulses.  No murmur heard. Pulmonary/Chest: Effort normal and breath sounds normal. No stridor. No respiratory distress. Has no wheezes.  Neurological: Is alert and oriented to person, place, and time.  Skin: Skin is warm.  Capillary refill takes less than 2 seconds. Yellow patchy lesions that are flat Psychiatric: Has a normal mood and affect. Behavior is normal. Judgment and thought content normal.    There are no preventive care reminders to display for this patient.  There are no preventive care reminders to display for this patient.  No results found for: TSH Lab Results  Component Value Date   WBC 4.9 07/14/2015   HGB 14.5 07/14/2015   HCT 43.2 07/14/2015   MCV 83.7 07/14/2015   PLT 230 07/14/2015   Lab Results  Component Value Date     NA 142 11/05/2019   K 4.5 11/05/2019   CO2 21 11/05/2019   GLUCOSE 148 (H) 11/05/2019   BUN 14 11/05/2019   CREATININE 0.90 11/05/2019   BILITOT 0.4 11/05/2019   ALKPHOS 68 11/05/2019   AST 24 11/05/2019   ALT 30 11/05/2019   PROT 6.8 11/05/2019   ALBUMIN 4.8 11/05/2019   CALCIUM 9.7 11/05/2019   ANIONGAP 8 07/14/2015   Lab Results  Component Value Date   CHOL 131 11/05/2019   Lab Results  Component Value Date   HDL 19 (L) 11/05/2019   Lab Results  Component Value Date   LDLCALC 37 11/05/2019   Lab Results  Component Value Date   TRIG 534 (H) 11/05/2019   Lab Results  Component Value Date   CHOLHDL 6.9 (H) 11/05/2019   Lab Results  Component Value Date   HGBA1C 11.0 (H) 11/05/2019      Assessment & Plan:    Problem List Items Addressed This Visit      Endocrine   Type 2 diabetes mellitus with hyperglycemia, without long-term current use of insulin (HCC)     Other   Hypertriglyceridemia - advised patient to continue healthy diet   Relevant Orders   Lipid Panel    Other Visit Diagnoses    Uncontrolled type 2 diabetes mellitus with hyperglycemia ([place])    -  Primary Improved, with metformin, diet and exercise Pt is very motivated with a positive attitude   Relevant Orders   POCT glycosylated hemoglobin (Hb A1C) (Completed)   POCT glucose (manual entry) (Completed)   Lipid Panel   VITAMIN D 25 Hydroxy (Vit-D Deficiency,  Fractures)   CMP14+EGFR   Encounter for medication refill       Tinea versicolor    -  Discussed oral and topical treatment   Relevant Medications   fluconazole (DIFLUCAN) 150 MG tablet      No orders of the defined types were placed in this encounter.   Follow-up: No follow-ups on file.    Forrest Moron, MD

## 2020-02-07 NOTE — Patient Instructions (Addendum)
If you have lab work done today you will be contacted with your lab results within the next 2 weeks.  If you have not heard from [place] then please contact us. The fastest way to get your results is to register for My Chart.   IF you received an x-ray today, you will receive an invoice from [facility]. Please contact [facility] at [phone] with questions or concerns regarding your invoice.   IF you received labwork today, you will receive an invoice from [place]. Please contact [facility] at [phone] with questions or concerns regarding your invoice.   Our billing staff will not be able to assist you with questions regarding bills from these companies.  You will be contacted with the lab results as soon as they are available. The fastest way to get your results is to activate your My Chart account. Instructions are located on the last page of this paperwork. If you have not heard from [place] regarding the results in 2 weeks, please contact this office.      Tinea Versicolor  Tinea versicolor is a common fungal infection of the skin. It causes a rash that appears as light or dark patches on the skin. The rash most often occurs on the chest, back, neck, or upper arms. This condition is more common during warm weather. Other than affecting how your skin looks, tinea versicolor usually does not cause other problems. In most cases, the infection goes away in a few weeks with treatment. It may take a few months for the patches on your skin to return to your usual skin color. What are the causes? This condition occurs when a type of fungus that is normally present on the skin starts to overgrow. This fungus is a kind of yeast. The exact cause of the overgrowth is not known. This condition cannot be passed from one person to another (it is not contagious). What increases the risk? This condition is more likely to develop when certain factors are present, such as:  Heat and  humidity.  Sweating too much.  Hormone changes.  Oily skin.  A weak disease-fighting system (immunesystem). What are the signs or symptoms? Symptoms of this condition include:  A rash of light or dark patches on your skin. The rash may have: ? Patches of tan or pink spots (on light skin). ? Patches of white or brown spots (on dark skin). ? Patches of skin that do not tan. ? Well-marked edges. ? Scales on the discolored areas.  Mild itching. How is this diagnosed? A health care provider can usually diagnose this condition by looking at your skin. During the exam, he or she may use ultraviolet (UV) light to see how much of your skin has been affected. In some cases, a skin sample may be taken by scraping the rash. This sample will be viewed under a microscope to check for yeast overgrowth. How is this treated? Treatment for this condition may include:  Dandruff shampoo that is applied to the affected skin during showers or bathing.  Over-the-counter medicated skin cream, lotion, or soaps.  Prescription antifungal medicine in the form of skin cream or pills.  Medicine to help reduce itching. Follow these instructions at home:  Take over-the-counter and prescription medicines only as told by your health care provider.  Apply dandruff shampoo to the affected area if your health care provider told you to do that. You may be instructed to scrub the affected skin for several minutes each  day.  Do not scratch the affected area of skin.  Avoid hot and humid conditions.  Do not use tanning booths.  Try to avoid sweating a lot. Contact a health care provider if:  Your symptoms get worse.  You have a fever.  You have redness, swelling, or pain at the site of your rash.  You have fluid or blood coming from your rash.  Your rash feels warm to the touch.  You have pus or a bad smell coming from your rash.  Your rash returns (recurs) after treatment. Summary  Tinea  versicolor is a common fungal infection of the skin. It causes a rash that appears as light or dark patches on the skin.  The rash most often occurs on the chest, back, neck, or upper arms.  A health care provider can usually diagnose this condition by looking at your skin.  Treatment may include applying shampoo to the skin and taking or applying medicines. This information is not intended to replace advice given to you by your health care provider. Make sure you discuss any questions you have with your health care provider. Document Revised: 08/29/2017 Document Reviewed: 05/20/2017 Elsevier Patient Education  2020 [employer].

## 2020-02-08 LAB — CMP14+EGFR
ALT: 23 IU/L (ref 0–44)
AST: 19 IU/L (ref 0–40)
Albumin/Globulin Ratio: 1.9 (ref 1.2–2.2)
Albumin: 4.7 g/dL (ref 4.0–5.0)
Alkaline Phosphatase: 62 IU/L (ref 39–117)
BUN/Creatinine Ratio: 19 (ref 9–20)
BUN: 17 mg/dL (ref 6–24)
Bilirubin Total: 0.4 mg/dL (ref 0.0–1.2)
CO2: 22 mmol/L (ref 20–29)
Calcium: 9.4 mg/dL (ref 8.7–10.2)
Chloride: 102 mmol/L (ref 96–106)
Creatinine, Ser: 0.89 mg/dL (ref 0.76–1.27)
GFR calc Af Amer: 122 mL/min/{1.73_m2} (ref >59)
GFR calc non Af Amer: 105 mL/min/{1.73_m2} (ref >59)
Globulin, Total: 2.5 g/dL (ref 1.5–4.5)
Glucose: 119 mg/dL — ABNORMAL HIGH (ref 65–99)
Potassium: 4.6 mmol/L (ref 3.5–5.2)
Sodium: 141 mmol/L (ref 134–144)
Total Protein: 7.2 g/dL (ref 6.0–8.5)

## 2020-02-08 LAB — LIPID PANEL
Chol/HDL Ratio: 5 ratio (ref 0.0–5.0)
Cholesterol, Total: 100 mg/dL (ref 100–199)
HDL: 20 mg/dL — ABNORMAL LOW (ref >39)
LDL Chol Calc (NIH): 35 mg/dL (ref 0–99)
Triglycerides: 294 mg/dL — ABNORMAL HIGH (ref 0–149)
VLDL Cholesterol Cal: 45 mg/dL — ABNORMAL HIGH (ref 5–40)

## 2020-02-08 LAB — VITAMIN D 25 HYDROXY (VIT D DEFICIENCY, FRACTURES): Vit D, 25-Hydroxy: 26.4 ng/mL — ABNORMAL LOW (ref 30.0–100.0)

## 2022-06-07 NOTE — Progress Notes (Signed)
HPI: James Hudson is a 46 y.o. male, who is here today to establish care.  Former PCP: Dr Creta Levin. Last preventive routine visit: A few years ago. He has annual DOT physicals.  Chronic medical problems: DM II and HLD among some.  DM II:Dx'ed in 05/2018 with HgA1C 8.7. He is on Metformin XR 500 mg bid. BS's 120-150. Last eye exam > a year ago.  Negative for abdominal pain, nausea,vomiting, polydipsia,polyuria, polyphagia, numbness,or foot ulcer/trauma.  He has not been consistent with following a healthful diet but for the past few months he has tried to do better. He eats subways salad bowls, granola bars for snack,and trying not to eat after 6 pm. Cokes zero and water. Does not exercise regularly.  Lab Results  Component Value Date   HGBA1C 8.8 (A) 02/07/2020   Lab Results  Component Value Date   LABMICR 10.7 11/05/2019   LABMICR 8.3 06/16/2018   HLD: He is not on pharmacologic treatment. Component     Latest Ref Rng 02/07/2020  Cholesterol, Total     100 - 199 mg/dL 409   Triglycerides     0.0 - 149.0 mg/dL 811 (H)   HDL Cholesterol     >39.00 mg/dL 20 (L)   VLDL Cholesterol Cal     5 - 40 mg/dL 45 (H)   Total CHOL/HDL Ratio 5.0   LDL Chol Calc (NIH)     0 - 99 mg/dL 35     Review of Systems  Constitutional:  Negative for activity change, appetite change and fever.  HENT:  Negative for nosebleeds and sore throat.   Eyes:  Negative for redness and visual disturbance.  Respiratory:  Negative for cough, shortness of breath and wheezing.   Cardiovascular:  Negative for chest pain, palpitations and leg swelling.  Gastrointestinal:  Negative for abdominal pain, nausea and vomiting.  Genitourinary:  Negative for decreased urine volume, dysuria and hematuria.  Musculoskeletal:  Negative for gait problem and myalgias.  Skin:  Negative for rash.  Neurological:  Negative for syncope, weakness and headaches.  Rest see pertinent positives and negatives per  HPI.  Current Outpatient Medications on File Prior to Visit  Medication Sig Dispense Refill   metFORMIN (GLUCOPHAGE-XR) 500 MG 24 hr tablet Take 1 tablet (500 mg total) by mouth 2 (two) times daily at 8 am and 10 pm. 180 tablet 1   No current facility-administered medications on file prior to visit.   Past Medical History:  Diagnosis Date   Diabetes mellitus without complication (HCC)    Hyperlipidemia    No Known Allergies  History reviewed. No pertinent family history.  Social History   Socioeconomic History   Marital status: Married    Spouse name: Not on file   Number of children: Not on file   Years of education: Not on file   Highest education level: Not on file  Occupational History   Not on file  Tobacco Use   Smoking status: Never   Smokeless tobacco: Never  Substance and Sexual Activity   Alcohol use: No   Drug use: No   Sexual activity: Not on file  Other Topics Concern   Not on file  Social History Narrative   Not on file   Social Determinants of Health   Financial Resource Strain: Not on file  Food Insecurity: Not on file  Transportation Needs: Not on file  Physical Activity: Not on file  Stress: Not on file  Social Connections: Not on file  Vitals:   06/10/22 0855  BP: 130/80  Pulse: 81  Resp: 12  Temp: 98.7 F (37.1 C)  SpO2: 95%   Body mass index is 34.43 kg/m.  Physical Exam Vitals and nursing note reviewed.  Constitutional:      General: He is not in acute distress.    Appearance: He is well-developed and well-groomed.  HENT:     Head: Normocephalic and atraumatic.     Mouth/Throat:     Mouth: Mucous membranes are moist.     Pharynx: Oropharynx is clear.  Eyes:     Conjunctiva/sclera: Conjunctivae normal.  Cardiovascular:     Rate and Rhythm: Normal rate and regular rhythm.     Pulses:          Dorsalis pedis pulses are 2+ on the right side and 2+ on the left side.     Heart sounds: No murmur heard. Pulmonary:      Effort: Pulmonary effort is normal. No respiratory distress.     Breath sounds: Normal breath sounds.  Abdominal:     Palpations: Abdomen is soft. There is no hepatomegaly or mass.     Tenderness: There is no abdominal tenderness.  Lymphadenopathy:     Cervical: No cervical adenopathy.  Skin:    General: Skin is warm.     Findings: No erythema or rash.  Neurological:     Mental Status: He is alert and oriented to person, place, and time.     Cranial Nerves: No cranial nerve deficit.     Gait: Gait normal.    Diabetic Foot Exam - Simple   Simple Foot Form Diabetic Foot exam was performed with the following findings: Yes 06/10/2022  9:36 AM  Visual Inspection No deformities, no ulcerations, no other skin breakdown bilaterally: Yes Sensation Testing Intact to touch and monofilament testing bilaterally: Yes Pulse Check Posterior Tibialis and Dorsalis pulse intact bilaterally: Yes Comments    ASSESSMENT AND PLAN:  [name] was seen today for establish care.  Diagnoses and all orders for this visit: Orders Placed This Encounter  Procedures   Microalbumin / creatinine urine ratio   Comprehensive metabolic panel   Lipid panel   Hepatitis C antibody   Hemoglobin A1c   LDL cholesterol, direct   Ambulatory referral to Gastroenterology   Amb Referral to Nutrition and Diabetic Education   Lab Results  Component Value Date   HGBA1C 9.1 (H) 06/10/2022   Lab Results  Component Value Date   CREATININE 0.96 06/10/2022   BUN 18 06/10/2022   NA 139 06/10/2022   K 4.6 06/10/2022   CL 102 06/10/2022   CO2 30 06/10/2022   Lab Results  Component Value Date   ALT 25 06/10/2022   AST 17 06/10/2022   ALKPHOS 54 06/10/2022   BILITOT 0.5 06/10/2022   Lab Results  Component Value Date   LABMICR 10.7 11/05/2019   LABMICR 8.3 06/16/2018   MICROALBUR 2.3 (H) 06/10/2022   Lab Results  Component Value Date   CHOL 108 06/10/2022   HDL 24.20 (L) 06/10/2022   LDLCALC 35 02/07/2020    LDLDIRECT 49.0 06/10/2022   TRIG 285.0 (H) 06/10/2022   CHOLHDL 4 06/10/2022   Type 2 diabetes mellitus with hyperglycemia, without long-term current use of insulin (HCC) HgA1C has not been at goal. Continue same dose of Metformin. He is not interested in adding medications, would like to improve diet, appt with diabetic educator will be arranged. Regular exercise and healthy diet with avoidance of added  sugar food intake is an important part of treatment and encouraged. Annual eye exam (needs to re-schedule), periodic dental and foot care recommended. F/U in 3-4 months  Hyperlipidemia Low HDL and elevated TG. We discussed CV benefits of statin medications. Further recommendations according to lipid panel results.  Class 1 obesity due to excess calories with serious comorbidity and body mass index (BMI) of 34.0 to 34.9 in adult Discussed benefits of wt loss as well as adverse effects of obesity. Consistency with healthy diet and physical activity encouraged. Making positive life style changes, small steps at the time. Appt with nutritionist will be arranged.  Encounter for HCV screening test for low risk patient -     Hepatitis C antibody  Colon cancer screening -     Ambulatory referral to Gastroenterology  I spent a total of 40 minutes in both face to face and non face to face activities for this visit on the date of this encounter. During this time history was obtained and documented, examination was performed, prior labs reviewed, and assessment/plan discussed.  Return in about 4 months (around 09/25/2022).  Razan Siler G. [place], MD  [facility]. [place] office.

## 2022-06-10 ENCOUNTER — Encounter: Payer: Self-pay | Admitting: Family Medicine

## 2022-06-10 ENCOUNTER — Ambulatory Visit (INDEPENDENT_AMBULATORY_CARE_PROVIDER_SITE_OTHER): Payer: No Typology Code available for payment source | Admitting: Family Medicine

## 2022-06-10 VITALS — BP 130/80 | HR 81 | Temp 98.7°F | Resp 12 | Ht 73.0 in | Wt 261.0 lb

## 2022-06-10 DIAGNOSIS — E785 Hyperlipidemia, unspecified: Secondary | ICD-10-CM | POA: Diagnosis not present

## 2022-06-10 DIAGNOSIS — Z1211 Encounter for screening for malignant neoplasm of colon: Secondary | ICD-10-CM

## 2022-06-10 DIAGNOSIS — Z1159 Encounter for screening for other viral diseases: Secondary | ICD-10-CM | POA: Diagnosis not present

## 2022-06-10 DIAGNOSIS — E6609 Other obesity due to excess calories: Secondary | ICD-10-CM | POA: Diagnosis not present

## 2022-06-10 DIAGNOSIS — Z6834 Body mass index (BMI) 34.0-34.9, adult: Secondary | ICD-10-CM

## 2022-06-10 DIAGNOSIS — E1165 Type 2 diabetes mellitus with hyperglycemia: Secondary | ICD-10-CM | POA: Diagnosis not present

## 2022-06-10 LAB — COMPREHENSIVE METABOLIC PANEL
ALT: 25 U/L (ref 0–53)
AST: 17 U/L (ref 0–37)
Albumin: 4.6 g/dL (ref 3.5–5.2)
Alkaline Phosphatase: 54 U/L (ref 39–117)
BUN: 18 mg/dL (ref 6–23)
CO2: 30 mEq/L (ref 19–32)
Calcium: 9.5 mg/dL (ref 8.4–10.5)
Chloride: 102 mEq/L (ref 96–112)
Creatinine, Ser: 0.96 mg/dL (ref 0.40–1.50)
GFR: 95.73 mL/min (ref >60.00)
Glucose, Bld: 149 mg/dL — ABNORMAL HIGH (ref 70–99)
Potassium: 4.6 mEq/L (ref 3.5–5.1)
Sodium: 139 mEq/L (ref 135–145)
Total Bilirubin: 0.5 mg/dL (ref 0.2–1.2)
Total Protein: 7.6 g/dL (ref 6.0–8.3)

## 2022-06-10 LAB — LIPID PANEL
Cholesterol: 108 mg/dL (ref 0–200)
HDL: 24.2 mg/dL — ABNORMAL LOW (ref >39.00)
NonHDL: 83.7
Total CHOL/HDL Ratio: 4
Triglycerides: 285 mg/dL — ABNORMAL HIGH (ref 0.0–149.0)
VLDL: 57 mg/dL — ABNORMAL HIGH (ref 0.0–40.0)

## 2022-06-10 LAB — HEMOGLOBIN A1C: Hgb A1c MFr Bld: 9.1 % — ABNORMAL HIGH (ref 4.6–6.5)

## 2022-06-10 LAB — LDL CHOLESTEROL, DIRECT: Direct LDL: 49 mg/dL

## 2022-06-10 LAB — MICROALBUMIN / CREATININE URINE RATIO
Creatinine,U: 75.4 mg/dL
Microalb Creat Ratio: 3 mg/g (ref 0.0–30.0)
Microalb, Ur: 2.3 mg/dL — ABNORMAL HIGH (ref 0.0–1.9)

## 2022-06-10 NOTE — Assessment & Plan Note (Signed)
Low HDL and elevated TG. We discussed CV benefits of statin medications. Further recommendations according to lipid panel results.

## 2022-06-10 NOTE — Patient Instructions (Addendum)
A few things to remember from today's visit:  Type 2 diabetes mellitus with hyperglycemia, without long-term current use of insulin (HCC) - Plan: Microalbumin / creatinine urine ratio, Comprehensive metabolic panel, Amb Referral to Nutrition and Diabetic Education  Hyperlipidemia, unspecified hyperlipidemia type - Plan: Comprehensive metabolic panel, Lipid panel  Encounter for HCV screening test for low risk patient - Plan: Hepatitis C antibody  Colon cancer screening - Plan: Ambulatory referral to Gastroenterology  If you need refills please call your pharmacy. Do not use My Chart to request refills or for acute issues that need immediate attention.   Please be sure medication list is accurate. If a new problem present, please set up appointment sooner than planned today. Diabetes Mellitus and Nutrition, Adult When you have diabetes, or diabetes mellitus, it is very important to have healthy eating habits because your blood sugar (glucose) levels are greatly affected by what you eat and drink. Eating healthy foods in the right amounts, at about the same times every day, can help you: Manage your blood glucose. Lower your risk of heart disease. Improve your blood pressure. Reach or maintain a healthy weight. What can affect my meal plan? Every person with diabetes is different, and each person has different needs for a meal plan. Your health care provider may recommend that you work with a dietitian to make a meal plan that is best for you. Your meal plan may vary depending on factors such as: The calories you need. The medicines you take. Your weight. Your blood glucose, blood pressure, and cholesterol levels. Your activity level. Other health conditions you have, such as heart or kidney disease. How do carbohydrates affect me? Carbohydrates, also called carbs, affect your blood glucose level more than any other type of food. Eating carbs raises the amount of glucose in your blood. It  is important to know how many carbs you can safely have in each meal. This is different for every person. Your dietitian can help you calculate how many carbs you should have at each meal and for each snack. How does alcohol affect me? Alcohol can cause a decrease in blood glucose (hypoglycemia), especially if you use insulin or take certain diabetes medicines by mouth. Hypoglycemia can be a life-threatening condition. Symptoms of hypoglycemia, such as sleepiness, dizziness, and confusion, are similar to symptoms of having too much alcohol. Do not drink alcohol if: Your health care provider tells you not to drink. You are pregnant, may be pregnant, or are planning to become pregnant. If you drink alcohol: Limit how much you have to: 0-1 drink a day for women. 0-2 drinks a day for men. Know how much alcohol is in your drink. In the [place] one drink equals one 12 oz bottle of beer (355 mL), one 5 oz glass of wine (148 mL), or one 1 oz glass of hard liquor (44 mL). Keep yourself hydrated with water, diet soda, or unsweetened iced tea. Keep in mind that regular soda, juice, and other mixers may contain a lot of sugar and must be counted as carbs. What are tips for following this plan?  Reading food labels Start by checking the serving size on the Nutrition Facts label of packaged foods and drinks. The number of calories and the amount of carbs, fats, and other nutrients listed on the label are based on one serving of the item. Many items contain more than one serving per package. Check the total grams (g) of carbs in one serving. Check the number of  grams of saturated fats and trans fats in one serving. Choose foods that have a low amount or none of these fats. Check the number of milligrams (mg) of salt (sodium) in one serving. Most people should limit total sodium intake to less than 2,300 mg per day. Always check the nutrition information of foods labeled as "low-fat" or "nonfat." These foods may  be higher in added sugar or refined carbs and should be avoided. Talk to your dietitian to identify your daily goals for nutrients listed on the label. Shopping Avoid buying canned, pre-made, or processed foods. These foods tend to be high in fat, sodium, and added sugar. Shop around the outside edge of the grocery store. This is where you will most often find fresh fruits and vegetables, bulk grains, fresh meats, and fresh dairy products. Cooking Use low-heat cooking methods, such as baking, instead of high-heat cooking methods, such as deep frying. Cook using healthy oils, such as olive, canola, or sunflower oil. Avoid cooking with butter, cream, or high-fat meats. Meal planning Eat meals and snacks regularly, preferably at the same times every day. Avoid going long periods of time without eating. Eat foods that are high in fiber, such as fresh fruits, vegetables, beans, and whole grains. Eat 4-6 oz (112-168 g) of lean protein each day, such as lean meat, chicken, fish, eggs, or tofu. One ounce (oz) (28 g) of lean protein is equal to: 1 oz (28 g) of meat, chicken, or fish. 1 egg.  cup (62 g) of tofu. Eat some foods each day that contain healthy fats, such as avocado, nuts, seeds, and fish. What foods should I eat? Fruits Berries. Apples. Oranges. Peaches. Apricots. Plums. Grapes. Mangoes. Papayas. Pomegranates. Kiwi. Cherries. Vegetables Leafy greens, including lettuce, spinach, kale, chard, collard greens, mustard greens, and cabbage. Beets. Cauliflower. Broccoli. Carrots. Green beans. Tomatoes. Peppers. Onions. Cucumbers. Brussels sprouts. Grains Whole grains, such as whole-wheat or whole-grain bread, crackers, tortillas, cereal, and pasta. Unsweetened oatmeal. Quinoa. Brown or wild rice. Meats and other proteins Seafood. Poultry without skin. Lean cuts of poultry and beef. Tofu. Nuts. Seeds. Dairy Low-fat or fat-free dairy products such as milk, yogurt, and cheese. The items listed  above may not be a complete list of foods and beverages you can eat and drink. Contact a dietitian for more information. What foods should I avoid? Fruits Fruits canned with syrup. Vegetables Canned vegetables. Frozen vegetables with butter or cream sauce. Grains Refined white flour and flour products such as bread, pasta, snack foods, and cereals. Avoid all processed foods. Meats and other proteins Fatty cuts of meat. Poultry with skin. Breaded or fried meats. Processed meat. Avoid saturated fats. Dairy Full-fat yogurt, cheese, or milk. Beverages Sweetened drinks, such as soda or iced tea. The items listed above may not be a complete list of foods and beverages you should avoid. Contact a dietitian for more information. Questions to ask a health care provider Do I need to meet with a certified diabetes care and education specialist? Do I need to meet with a dietitian? What number can I call if I have questions? When are the best times to check my blood glucose? Where to find more information: American Diabetes Association: diabetes.org Academy of Nutrition and Dietetics: eatright.[employer] of Diabetes and Digestive and Kidney Diseases: StageSync.si Association of Diabetes Care & Education Specialists: diabeteseducator.org Summary It is important to have healthy eating habits because your blood sugar (glucose) levels are greatly affected by what you eat and drink. It is  important to use alcohol carefully. A healthy meal plan will help you manage your blood glucose and lower your risk of heart disease. Your health care provider may recommend that you work with a dietitian to make a meal plan that is best for you. This information is not intended to replace advice given to you by your health care provider. Make sure you discuss any questions you have with your health care provider. Document Revised: 04/19/2020 Document Reviewed: 04/19/2020 Elsevier Patient Education  2023  [employer].

## 2022-06-10 NOTE — Assessment & Plan Note (Signed)
HgA1C has not been at goal. Continue same dose of Metformin. He is not interested in adding medications, would like to improve diet, appt with diabetic educator will be arranged. Regular exercise and healthy diet with avoidance of added sugar food intake is an important part of treatment and encouraged. Annual eye exam (needs to re-schedule), periodic dental and foot care recommended. F/U in 3-4 months

## 2022-06-11 LAB — HEPATITIS C ANTIBODY: Hepatitis C Ab: NONREACTIVE

## 2022-06-13 ENCOUNTER — Encounter: Payer: Self-pay | Admitting: Family Medicine

## 2022-06-14 ENCOUNTER — Other Ambulatory Visit: Payer: Self-pay

## 2022-06-14 DIAGNOSIS — Z760 Encounter for issue of repeat prescription: Secondary | ICD-10-CM

## 2022-06-14 MED ORDER — ROSUVASTATIN CALCIUM 10 MG PO TABS: 10.0000 mg | ORAL_TABLET | Freq: Every day | ORAL | 3 refills | Status: DC

## 2022-06-14 MED ORDER — METFORMIN HCL ER 500 MG PO TB24: ORAL_TABLET | ORAL | 1 refills | Status: DC

## 2022-07-05 ENCOUNTER — Ambulatory Visit: Payer: No Typology Code available for payment source | Admitting: Dietician

## 2022-07-22 ENCOUNTER — Telehealth: Payer: Self-pay

## 2022-07-22 NOTE — Telephone Encounter (Signed)
No show 8am PV. Spoke with pt at 8:12am at work with plan to call back between 9:15 and 9:30 for phone visit. No answer @ 9:16, 9:25, 9:35, 9:43, and 11:20.  Voice mailbox full. Unable to leave message.   If pt calls back will need to reschedule PV.

## 2022-07-22 NOTE — Telephone Encounter (Signed)
No call back after multiple calls to pt. And after speaking with him this am. Per protocol PV & [facility]-colon canceled and letter mailed encouraging call back to reschedule.

## 2022-07-22 NOTE — Telephone Encounter (Signed)
Voicemail full, unable to leave message @12 :16

## 2022-08-05 ENCOUNTER — Encounter: Payer: No Typology Code available for payment source | Admitting: Dietician

## 2022-08-16 ENCOUNTER — Encounter: Payer: No Typology Code available for payment source | Admitting: Gastroenterology

## 2022-10-11 NOTE — Progress Notes (Deleted)
     HPI: [name] is a 47 y.o. male, who is here today for chronic disease management.  Last seen on 06/10/2022.  Diabetes Mellitus II:  - Checking BG at home: *** - Medications: Metformin ER 500 mg 1 tab in the morning, 2 in the evening. - Compliance: *** - Diet: *** - Exercise: *** - eye exam: *** - foot exam: *** - microalbumin: *** - Negative for symptoms of hypoglycemia, polyuria, polydipsia, numbness extremities, foot ulcers/trauma  Lab Results  Component Value Date   HGBA1C 9.1 (H) 06/10/2022   Lab Results  Component Value Date   MICROALBUR 2.3 (H) 06/10/2022    Hyperlipidemia: Currently on Crestor 10 mg daily. Following a low fat diet: ***. Side effects from medication:*** Lab Results  Component Value Date   CHOL 108 06/10/2022   HDL 24.20 (L) 06/10/2022   LDLCALC 35 02/07/2020   LDLDIRECT 49.0 06/10/2022   TRIG 285.0 (H) 06/10/2022   CHOLHDL 4 06/10/2022     Review of Systems See other pertinent positives and negatives in HPI.  Current Outpatient Medications on File Prior to Visit  Medication Sig Dispense Refill   metFORMIN (GLUCOPHAGE-XR) 500 MG 24 hr tablet Take 1 tablet in the morning with breakfast and 2 tablets at dinner. 270 tablet 1   rosuvastatin (CRESTOR) 10 MG tablet Take 1 tablet (10 mg total) by mouth daily. 90 tablet 3   No current facility-administered medications on file prior to visit.    Past Medical History:  Diagnosis Date   Diabetes mellitus without complication ([place])    Hyperlipidemia    No Known Allergies  Social History   Socioeconomic History   Marital status: Married    Spouse name: Not on file   Number of children: Not on file   Years of education: Not on file   Highest education level: Not on file  Occupational History   Not on file  Tobacco Use   Smoking status: Never   Smokeless tobacco: Never  Substance and Sexual Activity   Alcohol use: No   Drug use: No   Sexual activity: Not on file   Other Topics Concern   Not on file  Social History Narrative   Not on file   Social Determinants of Health   Financial Resource Strain: Not on file  Food Insecurity: Not on file  Transportation Needs: Not on file  Physical Activity: Not on file  Stress: Not on file  Social Connections: Not on file    There were no vitals filed for this visit. There is no height or weight on file to calculate BMI.  Physical Exam  ASSESSMENT AND PLAN:  Diagnoses and all orders for this visit:  Type 2 diabetes mellitus with hyperglycemia, without long-term current use of insulin ([place])  Hyperlipidemia, unspecified hyperlipidemia type    No orders of the defined types were placed in this encounter.   No problem-specific Assessment & Plan notes found for this encounter.   No follow-ups on file.  Betty G. Martinique, MD  [facility]. [place] office.

## 2022-10-14 ENCOUNTER — Ambulatory Visit: Payer: No Typology Code available for payment source | Admitting: Family Medicine

## 2022-10-14 DIAGNOSIS — E1165 Type 2 diabetes mellitus with hyperglycemia: Secondary | ICD-10-CM

## 2022-10-14 DIAGNOSIS — E785 Hyperlipidemia, unspecified: Secondary | ICD-10-CM

## 2023-02-22 ENCOUNTER — Other Ambulatory Visit: Payer: Self-pay | Admitting: Family Medicine

## 2023-02-22 DIAGNOSIS — Z760 Encounter for issue of repeat prescription: Secondary | ICD-10-CM

## 2023-05-26 ENCOUNTER — Other Ambulatory Visit: Payer: Self-pay | Admitting: Family Medicine

## 2023-05-26 DIAGNOSIS — Z760 Encounter for issue of repeat prescription: Secondary | ICD-10-CM

## 2023-06-30 ENCOUNTER — Ambulatory Visit: Payer: No Typology Code available for payment source | Admitting: Family Medicine

## 2023-08-27 ENCOUNTER — Other Ambulatory Visit: Payer: Self-pay | Admitting: Family Medicine

## 2023-08-27 DIAGNOSIS — Z760 Encounter for issue of repeat prescription: Secondary | ICD-10-CM

## 2023-10-05 ENCOUNTER — Other Ambulatory Visit: Payer: Self-pay | Admitting: Family Medicine

## 2023-10-05 DIAGNOSIS — Z760 Encounter for issue of repeat prescription: Secondary | ICD-10-CM

## 2024-03-03 LAB — HEMOGLOBIN A1C: Hemoglobin A1C: 13.3

## 2024-03-08 ENCOUNTER — Telehealth: Payer: Self-pay

## 2024-03-08 ENCOUNTER — Inpatient Hospital Stay: Admitting: Family Medicine

## 2024-03-08 NOTE — Progress Notes (Signed)
 Chief Complaint  Patient presents with   Hospitalization Follow-up   HPI: [name] is a 48 y.o. male with a PMHx significant for DM II and HLD, who is here today for hospital follow up.  He was seen last on 06/10/2022.  Patient was hospitalized from 6/3 to 6/7 for diabetic ketoacidosis and acute pancreatitis in [place], [place]. TOC call on 03/08/24, unsuccessful x 2 attempts.  Presented to the ED the day of admission c/o severe RUQ abdominal pain with nausea. Anion gap metabolic acidosis noted; bicarb drip started; nephrology consulted Blood sugar low normal; pre-meal insulin held; bhb pending Lactic acid wnl Metabolic acidosis was thought to be likely chronic due to chronic due to grossly uncontrolled glucose and metformin .   Abdomen/pelvic CT 03/03/24: Acute pancreatitis with peripancreatic inflammatory stranding and small volume passive fluid.  There is no organized fluid collection.  The pancreas enhances uniformly.    CXR:No obvious focal consolidation however evaluation is limited.  Currently, he is not taking his Metformin  1000 mg daily, since last visit here he was taking his wife's metformin  sometimes.  He has not been taking it since he was discharged from the hospital, and had been out for a bit before then. 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 Asks today about Ozempic and Mounjaro.   He has been checking his blood sugar three times per day since the hospital, and says they have ranged from 99-137.  Endorses some upper abdominal pain since discharge, for which he has been taking tylenol ; but pain is greatly improved since ED initial evaluation.  Mentions he has been having some SOB when walking since his discharge. He was not having this problem prior to hospitalization. Denies associated chest pain, sudoresis, or pre-syncope.   He is interested in seeing a nutritionist, and says he has been feeling very well on the diet  recommended at the hospital.   Eye exam: ~6 months ago Foot exam: 2023. Pertinent negatives include leg swelling, numbness or tingling in his feet, cough, or wheezing.   Component Ref Range & Units 3 d ago  Sodium 136 - 145 mmol/L 135 Low   Potassium 3.5 - 5.1 mmol/L 3.5  Chloride 98 - 107 mmol/L 101  CO2 22 - 29 mmol/L 14 Low   Anion Gap 7 - 16 mmol/L 20 High   Glucose 74 - 99 mg/dL 79  BUN 6 - 20 mg/dL 14  Creatinine 0.7 - 1.2 mg/dL 0.6 Low   Est, Glom Filt Rate >60 mL/min/1.50m2 >90   NT BNP, D dimer, and troponins in normal range.  Component Ref Range & Units 6 d ago  WBC 4.5 - 11.5 k/uL 5.9  RBC 3.80 - 5.80 m/uL 5.4  Hemoglobin 12.5 - 16.5 g/dL 42.5  Hematocrit 95.6 - 54.0 % 47.2  MCV 80.0 - 99.9 fL 87.4  MCH 26.0 - 35.0 pg 28.9  MCHC 32.0 - 34.5 g/dL 38.7  RDW 56.4 - 33.2 % 14.6  Platelets 130 - 450 k/uL 240  MPV 7.0 - 12.0 fL 12.2 High   Neutrophils % 43.0 - 80.0 % 74  Lymphocytes % 20.0 - 42.0 % 20  Monocytes % 2.0 - 12.0 % 6  Eosinophils % 0 - 6 % 0  Basophils % 0.0 - 2.0 % 0  Immature Granulocytes % 0.0 - 5.0 % 0  Neutrophils Absolute 1.80 - 7.30 k/uL 4.37  Lymphocytes Absolute 1.50 - 4.00 k/uL 1.16 Low   Monocytes  Absolute 0.10 - 0.95 k/uL 0.34  Eosinophils Absolute 0.05 - 0.50 k/uL 0 Low   Basophils Absolute 0.00 - 0.20 k/uL 0.02  Immature Granulocytes Absolute 0.00 - 0.58 k/uL <0.03   Lab Results  Component Value Date   HGBA1C 13.3 03/03/2024   Hyperlipidemia: He has also not been taking rosuvastatin .  TG on 03/06/24 was 282.  Lab Results  Component Value Date   CHOL 108 06/10/2022   HDL 24.20 (L) 06/10/2022   LDLCALC 35 02/07/2020   LDLDIRECT 49.0 06/10/2022   TRIG 285.0 (H) 06/10/2022   CHOLHDL 4 06/10/2022   Review of Systems  Constitutional:  Positive for activity change and fatigue. Negative for appetite change.  HENT:  Negative for nosebleeds, sore throat and trouble swallowing.   Eyes:  Negative for  redness and visual disturbance.  Respiratory:  Negative for cough and wheezing.   Gastrointestinal:  Negative for nausea and vomiting.  Endocrine: Negative for polydipsia, polyphagia and polyuria.  Genitourinary:  Negative for decreased urine volume, dysuria and hematuria.  Musculoskeletal:  Negative for gait problem and myalgias.  Skin:  Negative for rash.  Neurological:  Negative for syncope, weakness and headaches.  See other pertinent positives and negatives in HPI.  No current outpatient medications on file prior to visit.   No current facility-administered medications on file prior to visit.   Past Medical History:  Diagnosis Date   Diabetes mellitus without complication (HCC)    Hyperlipidemia    No Known Allergies  Social History   Socioeconomic History   Marital status: Married    Spouse name: Not on file   Number of children: Not on file   Years of education: Not on file   Highest education level: Not on file  Occupational History   Not on file  Tobacco Use   Smoking status: Never   Smokeless tobacco: Never  Substance and Sexual Activity   Alcohol use: No   Drug use: No   Sexual activity: Not on file  Other Topics Concern   Not on file  Social History Narrative   Not on file   Social Drivers of Health   Financial Resource Strain: Not on file  Food Insecurity: Not on file  Transportation Needs: Not on file  Physical Activity: Not on file  Stress: Not on file  Social Connections: Not on file   Today's Vitals   03/09/24 0807  BP: 136/70  Pulse: 97  Resp: 12  SpO2: 99%  Weight: 240 lb (108.9 kg)  Height: 6\' 1"  (1.854 m)   Body mass index is 31.66 kg/m.  Physical Exam Vitals and nursing note reviewed.  Constitutional:      General: He is not in acute distress.    Appearance: He is well-developed.  HENT:     Head: Normocephalic and atraumatic.     Mouth/Throat:     Mouth: Mucous membranes are moist.     Pharynx: Oropharynx is clear. Uvula  midline.  Eyes:     Conjunctiva/sclera: Conjunctivae normal.  Cardiovascular:     Rate and Rhythm: Normal rate and regular rhythm.     Pulses:          Dorsalis pedis pulses are 2+ on the right side and 2+ on the left side.     Heart sounds: No murmur heard. Pulmonary:     Effort: Pulmonary effort is normal. No respiratory distress.     Breath sounds: Normal breath sounds.  Abdominal:     Palpations: Abdomen is  soft. There is no hepatomegaly or mass.     Tenderness: There is no abdominal tenderness.  Musculoskeletal:     Right lower leg: No edema.     Left lower leg: No edema.  Lymphadenopathy:     Cervical: No cervical adenopathy.  Skin:    General: Skin is warm.     Findings: No erythema or rash.  Neurological:     Mental Status: He is alert and oriented to person, place, and time.     Cranial Nerves: No cranial nerve deficit.     Gait: Gait normal.  Psychiatric:        Mood and Affect: Mood and affect normal.   ASSESSMENT AND PLAN:  Mr. Fronczak was seen today for hospital follow up.   Lab Results  Component Value Date   NA 134 (L) 03/09/2024   CL 102 03/09/2024   K 4.1 03/09/2024   CO2 13 (L) 03/09/2024   BUN 16 03/09/2024   CREATININE 0.80 03/09/2024   GFR 105.88 03/09/2024   CALCIUM  9.2 03/09/2024   ALBUMIN 4.1 03/09/2024   GLUCOSE 108 (H) 03/09/2024   Lab Results  Component Value Date   ALT 16 03/09/2024   AST 16 03/09/2024   ALKPHOS 78 03/09/2024   BILITOT 0.4 03/09/2024   Lab Results  Component Value Date   WBC 5.1 03/09/2024   HGB 12.8 (L) 03/09/2024   HCT 39.5 03/09/2024   MCV 86.7 03/09/2024   PLT 376.0 03/09/2024   Lab Results  Component Value Date   LIPASE 16.0 03/09/2024   Lab Results  Component Value Date   LABMICR 10.7 11/05/2019   LABMICR 8.3 06/16/2018   MICROALBUR 7.9 (H) 03/09/2024   MICROALBUR 2.3 (H) 06/10/2022   Lab Results  Component Value Date   CHOL 184 03/09/2024   HDL 15.70 (L) 03/09/2024   LDLCALC 114 (H)  03/09/2024   LDLDIRECT 49.0 06/10/2022   TRIG 271.0 (H) 03/09/2024   CHOLHDL 12 03/09/2024   Type 2 diabetes mellitus with hyperglycemia, without long-term current use of insulin (HCC) Assessment & Plan: Problem is not well controlled. HgA1C 13. He does not want insulin due to work. Resume Metformin  ER 500 mg 2 tabs daily. Ozempic started today, 0.25 mg weekly and titrate up q 4 weeks to a goal of 1 mg weekly. We discussed some side effects. Regular exercise and healthy diet with avoidance of added sugar food intake is an important part of treatment and recommended. Annual eye exam, periodic dental and foot care recommended. F/U in 4 months.  Orders: -     Comprehensive metabolic panel with GFR; Future -     Microalbumin / creatinine urine ratio; Future -     Amb Referral to Nutrition and Diabetic Education -     metFORMIN  HCl ER; Take 2 tablets (1,000 mg total) by mouth daily with supper.  Dispense: 180 tablet; Refill: 2 -     Ozempic (0.25 or 0.5 MG/DOSE); Start 0.25 mg [place] weekly for 4 weeks then 0.5 mg x 4 weeks, and then 1 mg weekly [place]. Call to have med refilled when on 1 mg weekly.  Dispense: 3 mL; Refill: 0  SOB (shortness of breath)  Noted after hospital discharge. We discussed possible etiologies. ? Deconditioning. For now we will hold on further work up. Instructed about warning signs.  Hyperlipidemia, unspecified hyperlipidemia type Assessment & Plan: Last LDL 49 in 05/2022. Currently he is not on rosuvastatin .Further recommendations in regard to dose will be given  according to lipid panel result.  Orders: -     Comprehensive metabolic panel with GFR; Future -     Lipid panel; Future -     Rosuvastatin  Calcium ; Take 1 tablet (10 mg total) by mouth daily.  Dispense: 90 tablet; Refill: 3  Acute pancreatitis, unspecified complication status, unspecified pancreatitis type Lipase normalized before hospital discharge. Still having mild upper abdominal pain. Instructed  about warning signs. Further recommendations according to lab results. Side effects of Ozempic discussed.  -     CBC; Future -     Lipase; Future Colon cancer screening - Ambulatory referral to Gastroenterology  I spent a total of 44 minutes in both face to face and non face to face activities for this visit on the date of this encounter. During this time history was obtained and documented, examination was performed, prior labs/imaging reviewed, and assessment/plan discussed.  Return in about 4 months (around 07/09/2024) for chronic problems.  I, James Hudson, acting as a scribe for Rodneshia Greenhouse [place], MD., have documented all relevant documentation on the behalf of James Boley [place], MD, as directed by  James Fifield [place], MD while in the presence of James Lehtinen [place], MD.   I, James Mehlhoff [place], MD, have reviewed all documentation for this visit. The documentation on 03/09/24 for the exam, diagnosis, procedures, and orders are all accurate and complete.  Jace Dowe G. [place], MD  [facility]. [place] office.

## 2024-03-08 NOTE — Transitions of Care (Post Inpatient/ED Visit) (Signed)
   03/08/2024  Name: James Hudson MRN: [id number] DOB: 25-Oct-1975  Today's TOC FU Call Status: Today's TOC FU Call Status:: Unsuccessful Call (1st Attempt) Unsuccessful Call (1st Attempt) Date: 03/08/24  Attempted to reach the patient regarding the most recent Inpatient/ED visit.  Follow Up Plan: Additional outreach attempts will be made to reach the patient to complete the Transitions of Care (Post Inpatient/ED visit) call.   Orpha Blade, RN, BSN, CEN [employer]- Transition of Care Team.  [facility] [phone]

## 2024-03-08 NOTE — Transitions of Care (Post Inpatient/ED Visit) (Signed)
   03/08/2024  Name: James Hudson MRN: [id number] DOB: 04/14/1976  Today's TOC FU Call Status: Today's TOC FU Call Status:: Unsuccessful Call (2nd Attempt) Unsuccessful Call (2nd Attempt) Date: 03/08/24  Attempted to reach the patient regarding the most recent Inpatient/ED visit.  Follow Up Plan: Additional outreach attempts will be made to reach the patient to complete the Transitions of Care (Post Inpatient/ED visit) call.   Orpha Blade, RN, BSN, CEN [employer]- Transition of Care Team.  [facility] [phone]

## 2024-03-09 ENCOUNTER — Ambulatory Visit: Payer: Self-pay | Admitting: Family Medicine

## 2024-03-09 ENCOUNTER — Other Ambulatory Visit: Payer: Self-pay

## 2024-03-09 ENCOUNTER — Ambulatory Visit: Admitting: Family Medicine

## 2024-03-09 ENCOUNTER — Encounter: Payer: Self-pay | Admitting: Family Medicine

## 2024-03-09 ENCOUNTER — Telehealth: Payer: Self-pay

## 2024-03-09 VITALS — BP 136/70 | HR 97 | Resp 12 | Ht 73.0 in | Wt 240.0 lb

## 2024-03-09 DIAGNOSIS — E785 Hyperlipidemia, unspecified: Secondary | ICD-10-CM | POA: Diagnosis not present

## 2024-03-09 DIAGNOSIS — Z7984 Long term (current) use of oral hypoglycemic drugs: Secondary | ICD-10-CM

## 2024-03-09 DIAGNOSIS — Z7985 Long-term (current) use of injectable non-insulin antidiabetic drugs: Secondary | ICD-10-CM

## 2024-03-09 DIAGNOSIS — Z1211 Encounter for screening for malignant neoplasm of colon: Secondary | ICD-10-CM

## 2024-03-09 DIAGNOSIS — E1165 Type 2 diabetes mellitus with hyperglycemia: Secondary | ICD-10-CM | POA: Diagnosis not present

## 2024-03-09 DIAGNOSIS — K859 Acute pancreatitis without necrosis or infection, unspecified: Secondary | ICD-10-CM | POA: Diagnosis not present

## 2024-03-09 DIAGNOSIS — R0602 Shortness of breath: Secondary | ICD-10-CM

## 2024-03-09 LAB — LIPID PANEL
Cholesterol: 184 mg/dL (ref 0–200)
HDL: 15.7 mg/dL — ABNORMAL LOW (ref >39.00)
LDL Cholesterol: 114 mg/dL — ABNORMAL HIGH (ref 0–99)
NonHDL: 167.83
Total CHOL/HDL Ratio: 12
Triglycerides: 271 mg/dL — ABNORMAL HIGH (ref 0.0–149.0)
VLDL: 54.2 mg/dL — ABNORMAL HIGH (ref 0.0–40.0)

## 2024-03-09 LAB — CBC
HCT: 39.5 % (ref 39.0–52.0)
Hemoglobin: 12.8 g/dL — ABNORMAL LOW (ref 13.0–17.0)
MCHC: 32.5 g/dL (ref 30.0–36.0)
MCV: 86.7 fl (ref 78.0–100.0)
Platelets: 376 10*3/uL (ref 150.0–400.0)
RBC: 4.56 Mil/uL (ref 4.22–5.81)
RDW: 14.9 % (ref 11.5–15.5)
WBC: 5.1 10*3/uL (ref 4.0–10.5)

## 2024-03-09 LAB — MICROALBUMIN / CREATININE URINE RATIO
Creatinine,U: 53 mg/dL
Microalb Creat Ratio: 149.7 mg/g — ABNORMAL HIGH (ref 0.0–30.0)
Microalb, Ur: 7.9 mg/dL — ABNORMAL HIGH (ref 0.0–1.9)

## 2024-03-09 LAB — COMPREHENSIVE METABOLIC PANEL WITH GFR
ALT: 16 U/L (ref 0–53)
AST: 16 U/L (ref 0–37)
Albumin: 4.1 g/dL (ref 3.5–5.2)
Alkaline Phosphatase: 78 U/L (ref 39–117)
BUN: 16 mg/dL (ref 6–23)
CO2: 13 meq/L — ABNORMAL LOW (ref 19–32)
Calcium: 9.2 mg/dL (ref 8.4–10.5)
Chloride: 102 meq/L (ref 96–112)
Creatinine, Ser: 0.8 mg/dL (ref 0.40–1.50)
GFR: 105.88 mL/min (ref >60.00)
Glucose, Bld: 108 mg/dL — ABNORMAL HIGH (ref 70–99)
Potassium: 4.1 meq/L (ref 3.5–5.1)
Sodium: 134 meq/L — ABNORMAL LOW (ref 135–145)
Total Bilirubin: 0.4 mg/dL (ref 0.2–1.2)
Total Protein: 7.8 g/dL (ref 6.0–8.3)

## 2024-03-09 LAB — LIPASE: Lipase: 16 U/L (ref 11.0–59.0)

## 2024-03-09 MED ORDER — OZEMPIC (0.25 OR 0.5 MG/DOSE) 2 MG/3ML ~~LOC~~ SOPN: PEN_INJECTOR | SUBCUTANEOUS | 0 refills | Status: DC

## 2024-03-09 MED ORDER — METFORMIN HCL ER 500 MG PO TB24: 1000.0000 mg | ORAL_TABLET | Freq: Every day | ORAL | 2 refills | Status: DC

## 2024-03-09 MED ORDER — ROSUVASTATIN CALCIUM 10 MG PO TABS: 10.0000 mg | ORAL_TABLET | Freq: Every day | ORAL | 3 refills | Status: DC

## 2024-03-09 MED ORDER — DAPAGLIFLOZIN PROPANEDIOL 10 MG PO TABS: 10.0000 mg | ORAL_TABLET | Freq: Every day | ORAL | 3 refills | Status: DC

## 2024-03-09 NOTE — Transitions of Care (Post Inpatient/ED Visit) (Signed)
   03/09/2024  Name: James Hudson MRN: [id number] DOB: Jun 13, 1976  Today's TOC FU Call Status: Today's TOC FU Call Status:: Unsuccessful Call (3rd Attempt) Unsuccessful Call (3rd Attempt) Date: 03/09/24  Attempted to reach the patient regarding the most recent Inpatient/ED visit.  Follow Up Plan: No further outreach attempts will be made at this time. We have been unable to contact the patient.  Orpha Blade, RN, BSN, CEN [employer]- Transition of Care Team.  [facility] [phone]

## 2024-03-09 NOTE — Assessment & Plan Note (Signed)
 Problem is not well controlled. HgA1C 13. He does not want insulin due to work. Resume Metformin  ER 500 mg 2 tabs daily. Ozempic started today, 0.25 mg weekly and titrate up q 4 weeks to a goal of 1 mg weekly. We discussed some side effects. Regular exercise and healthy diet with avoidance of added sugar food intake is an important part of treatment and recommended. Annual eye exam, periodic dental and foot care recommended. F/U in 4 months.

## 2024-03-09 NOTE — Telephone Encounter (Signed)
 Pharmacy Patient Advocate Encounter   Received notification from CoverMyMeds that prior authorization for Ozempic (0.25 or 0.5 MG/DOSE) 2MG /3ML pen-injectors is required/requested.   Insurance verification completed.   The patient is insured through [facility] .   Per test claim: PA required; PA submitted to above mentioned insurance via CoverMyMeds Key/confirmation #/EOC [facility] Status is pending

## 2024-03-09 NOTE — Patient Instructions (Addendum)
 A few things to remember from today's visit:  Type 2 diabetes mellitus with hyperglycemia, without long-term current use of insulin (HCC) - Plan: Comprehensive metabolic panel with GFR, Microalbumin / creatinine urine ratio, Amb Referral to Nutrition and Diabetic Education, metFORMIN  (GLUCOPHAGE -XR) 500 MG 24 hr tablet  Hyperlipidemia, unspecified hyperlipidemia type - Plan: Comprehensive metabolic panel with GFR, Lipid panel  Acute pancreatitis, unspecified complication status, unspecified pancreatitis type - Plan: CBC, Lipase Resume Metformin  2 tabs with supper. Ozempic will be sent after blood work is back and if it is normal. Start low dose and will titrate up to 1 mg weekly.  If you need refills for medications you take chronically, please call your pharmacy. Do not use My Chart to request refills or for acute issues that need immediate attention. If you send a my chart message, it may take a few days to be addressed, specially if I am not in the office.  Please be sure medication list is accurate. If a new problem present, please set up appointment sooner than planned today.

## 2024-03-09 NOTE — Assessment & Plan Note (Signed)
 Last LDL 49 in 05/2022. Currently he is not on rosuvastatin .Further recommendations in regard to dose will be given according to lipid panel result.

## 2024-03-10 ENCOUNTER — Other Ambulatory Visit (HOSPITAL_COMMUNITY): Payer: Self-pay

## 2024-03-11 ENCOUNTER — Other Ambulatory Visit (HOSPITAL_COMMUNITY): Payer: Self-pay

## 2024-03-11 NOTE — Telephone Encounter (Signed)
 Pharmacy Patient Advocate Encounter  Received notification from [facility] that Prior Authorization for Ozempic (0.25 or 0.5 MG/DOSE) 2MG /3ML pen-injectors  has been APPROVED from 03/09/2024 to 03/10/2027 CAN NOT RUN TEST CLAIM. PHARMACY NOT CONTRACTED    PA #/Case ID/Reference #: [id number]

## 2024-03-17 ENCOUNTER — Ambulatory Visit: Admitting: Dietician

## 2024-04-04 ENCOUNTER — Other Ambulatory Visit: Payer: Self-pay | Admitting: Family Medicine

## 2024-04-04 DIAGNOSIS — E1165 Type 2 diabetes mellitus with hyperglycemia: Secondary | ICD-10-CM

## 2024-05-06 ENCOUNTER — Telehealth: Payer: Self-pay | Admitting: *Deleted

## 2024-05-06 DIAGNOSIS — E1165 Type 2 diabetes mellitus with hyperglycemia: Secondary | ICD-10-CM

## 2024-05-06 NOTE — Telephone Encounter (Signed)
 Copied from CRM [id number]. 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 He had it last checked in June. If he's able to come in to get it done soon,would that be an OV or NV? Please advise.

## 2024-05-07 ENCOUNTER — Encounter: Admitting: Internal Medicine

## 2024-05-07 NOTE — Addendum Note (Signed)
 Addended by: EVELINE LAURAINE BRAVO on: 05/07/2024 09:41 AM   Modules accepted: Orders

## 2024-05-07 NOTE — Telephone Encounter (Signed)
 I called and spoke with patient. He is aware that he can have the lab done, but insurance may reject it due it not being 3 months since last one. Price from front desk is estimated to be around $12.66. Pt is okay with this, so lab appt made for Friday 8/15 at 4:20pm.

## 2024-05-08 ENCOUNTER — Other Ambulatory Visit: Payer: Self-pay | Admitting: Family Medicine

## 2024-05-08 DIAGNOSIS — E1165 Type 2 diabetes mellitus with hyperglycemia: Secondary | ICD-10-CM

## 2024-05-10 ENCOUNTER — Encounter: Payer: Self-pay | Admitting: Gastroenterology

## 2024-05-14 ENCOUNTER — Other Ambulatory Visit

## 2024-05-17 ENCOUNTER — Other Ambulatory Visit (INDEPENDENT_AMBULATORY_CARE_PROVIDER_SITE_OTHER)

## 2024-05-17 DIAGNOSIS — E1165 Type 2 diabetes mellitus with hyperglycemia: Secondary | ICD-10-CM

## 2024-05-19 LAB — HEMOGLOBIN A1C
Est. average glucose Bld gHb Est-mCnc: 163 mg/dL
Hgb A1c MFr Bld: 7.3 % — ABNORMAL HIGH (ref 4.8–5.6)

## 2024-05-20 ENCOUNTER — Telehealth: Payer: Self-pay | Admitting: *Deleted

## 2024-05-20 NOTE — Telephone Encounter (Signed)
 Copied from CRM #[id number]. Topic: Clinical - Lab/Test Results >> May 20, 2024  8:44 AM Viola F wrote: Reason for CRM: Patient requested a call back to discuss lab results from 05/17/24. Please call [phone] (M)

## 2024-05-21 NOTE — Telephone Encounter (Signed)
 I spoke with pt, he is aware A1C was 7.3%. 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 Copy printed & placed up front for pt to pick up.

## 2024-05-28 ENCOUNTER — Other Ambulatory Visit: Payer: Self-pay

## 2024-05-28 ENCOUNTER — Ambulatory Visit (AMBULATORY_SURGERY_CENTER)

## 2024-05-28 VITALS — Ht 73.0 in | Wt 248.0 lb

## 2024-05-28 DIAGNOSIS — Z1211 Encounter for screening for malignant neoplasm of colon: Secondary | ICD-10-CM

## 2024-05-28 MED ORDER — NA SULFATE-K SULFATE-MG SULF 17.5-3.13-1.6 GM/177ML PO SOLN
1.0000 | Freq: Once | ORAL | 0 refills | Status: AC
Start: 2024-05-28 — End: 2024-05-28

## 2024-05-28 NOTE — Progress Notes (Signed)
 Denies allergies to eggs or soy products. Denies complication of anesthesia or sedation. Denies use of weight loss medication. Denies use of O2.   Emmi instructions given for colonoscopy.

## 2024-06-01 ENCOUNTER — Encounter: Payer: Self-pay | Admitting: Gastroenterology

## 2024-06-25 ENCOUNTER — Encounter: Admitting: Gastroenterology

## 2024-06-25 ENCOUNTER — Telehealth: Payer: Self-pay | Admitting: Gastroenterology

## 2024-06-25 NOTE — Telephone Encounter (Signed)
 Good Morning Dr. Stacia,  I called this patient this morning at 10:15 am  I left a message on his voicemail that if he was running late or needed to reschedule to call us .  I will NO SHOW him.  [employer]

## 2024-07-09 ENCOUNTER — Ambulatory Visit: Admitting: Family Medicine

## 2024-07-12 ENCOUNTER — Ambulatory Visit (INDEPENDENT_AMBULATORY_CARE_PROVIDER_SITE_OTHER): Admitting: Family Medicine

## 2024-07-12 ENCOUNTER — Ambulatory Visit: Payer: Self-pay | Admitting: Family Medicine

## 2024-07-12 ENCOUNTER — Encounter: Payer: Self-pay | Admitting: Family Medicine

## 2024-07-12 VITALS — BP 114/70 | HR 87 | Temp 97.9°F | Resp 16 | Ht 73.0 in | Wt 255.0 lb

## 2024-07-12 DIAGNOSIS — B360 Pityriasis versicolor: Secondary | ICD-10-CM | POA: Diagnosis not present

## 2024-07-12 DIAGNOSIS — E1165 Type 2 diabetes mellitus with hyperglycemia: Secondary | ICD-10-CM | POA: Diagnosis not present

## 2024-07-12 DIAGNOSIS — Z7985 Long-term (current) use of injectable non-insulin antidiabetic drugs: Secondary | ICD-10-CM

## 2024-07-12 DIAGNOSIS — E785 Hyperlipidemia, unspecified: Secondary | ICD-10-CM

## 2024-07-12 DIAGNOSIS — Z7984 Long term (current) use of oral hypoglycemic drugs: Secondary | ICD-10-CM

## 2024-07-12 LAB — MICROALBUMIN / CREATININE URINE RATIO
Creatinine,U: 80.5 mg/dL
Microalb Creat Ratio: 19.7 mg/g (ref 0.0–30.0)
Microalb, Ur: 1.6 mg/dL (ref 0.0–1.9)

## 2024-07-12 LAB — LIPID PANEL
Cholesterol: 145 mg/dL (ref 0–200)
HDL: 30.9 mg/dL — ABNORMAL LOW (ref >39.00)
LDL Cholesterol: 84 mg/dL (ref 0–99)
NonHDL: 114.44
Total CHOL/HDL Ratio: 5
Triglycerides: 154 mg/dL — ABNORMAL HIGH (ref 0.0–149.0)
VLDL: 30.8 mg/dL (ref 0.0–40.0)

## 2024-07-12 LAB — ALT: ALT: 18 U/L (ref 0–53)

## 2024-07-12 MED ORDER — FLUCONAZOLE 150 MG PO TABS: 300.0000 mg | ORAL_TABLET | ORAL | 0 refills | Status: AC

## 2024-07-12 MED ORDER — KETOCONAZOLE 2 % EX SHAM: 1.0000 | MEDICATED_SHAMPOO | CUTANEOUS | 2 refills | Status: DC

## 2024-07-12 NOTE — Progress Notes (Signed)
 Chief Complaint  Patient presents with   Medical Management of Chronic Issues    Four month follow-up     James Hudson is a 48 y.o. male, who is here today for chronic disease management. Last seen on 03/09/2024 for hospital follow-up. Diabetes Mellitus II: Dx'ed in 2016 glucose 335. He has not had an eye exam in over a year. Monitoring BS, fasting 90s to low 100s, postprandial up to 170. Currently he is on Ozempic  0.5 mg weekly and metformin  at 500 mg 2 tabs twice daily.  He did not start Farxiga , which were recommended for microalbuminuria. - Negative for symptoms of hypoglycemia, polyuria, polydipsia, numbness extremities, foot ulcers/trauma He has made dietary changes. 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  Lab Results  Component Value Date   HGBA1C 7.3 (H) 05/17/2024   Lab Results  Component Value Date   MICROALBUR 7.9 (H) 03/09/2024   Lab Results  Component Value Date   CREATININE 0.80 03/09/2024   BUN 16 03/09/2024   NA 134 (L) 03/09/2024   K 4.1 03/09/2024   CL 102 03/09/2024   CO2 13 (L) 03/09/2024   Hyperlipidemia: He is currently on rosuvastatin  10 mg daily, has not taken it for about 2 weeks because last medication bottle. No side effects reported. Lab Results  Component Value Date   CHOL 184 03/09/2024   HDL 15.70 (L) 03/09/2024   LDLCALC 114 (H) 03/09/2024   LDLDIRECT 49.0 06/10/2022   TRIG 271.0 (H) 03/09/2024   CHOLHDL 12 03/09/2024   Today he is complaining of intermittent, nonpruritic skin rash, which he has had for years and started back about a year ago and has noted new lesions for the past few weeks. Former PCP prescribed pill for fungal infection, which helped. He has not tried any OTC medication. Has not identified exacerbating factors.  Review of Systems  Constitutional:  Negative for activity change, appetite change, chills and fever.  HENT:  Negative for mouth  sores and sore throat.   Respiratory:  Negative for cough, shortness of breath and wheezing.   Cardiovascular:  Negative for chest pain, palpitations and leg swelling.  Gastrointestinal:  Negative for abdominal pain, nausea and vomiting.  Genitourinary:  Negative for decreased urine volume, dysuria and hematuria.  Musculoskeletal:  Negative for myalgias.  Skin:  Positive for rash. Negative for wound.  Neurological:  Negative for syncope, weakness and headaches.  See other pertinent positives and negatives in HPI.  Current Outpatient Medications on File Prior to Visit  Medication Sig Dispense Refill   metFORMIN  (GLUCOPHAGE -XR) 500 MG 24 hr tablet Take 2 tablets (1,000 mg total) by mouth daily with supper. 180 tablet 2   rosuvastatin  (CRESTOR ) 10 MG tablet Take 1 tablet (10 mg total) by mouth daily. 90 tablet 3   Semaglutide ,0.25 or 0.5MG /DOS, (OZEMPIC , 0.25 OR 0.5 MG/DOSE,) 2 MG/3ML SOPN START 0.25 MG [place] WEEKLY FOR 4 WEEKS THEN 0.5 MG X 4 WEEKS, AND THEN 1 MG WEEKLY [place]. CALL TO HAVE MED REFILLED WHEN ON 1 MG WEEKLY. 3 mL 1   No current facility-administered medications on file prior to visit.   Past Medical History:  Diagnosis Date   Diabetes mellitus without complication (HCC)    Hyperlipidemia    No Known Allergies  Social History   Socioeconomic History   Marital status: Married    Spouse name: Not on file   Number of children: Not on file  Years of education: Not on file   Highest education level: Not on file  Occupational History   Not on file  Tobacco Use   Smoking status: Never   Smokeless tobacco: Never  Substance and Sexual Activity   Alcohol use: No   Drug use: No   Sexual activity: Not on file  Other Topics Concern   Not on file  Social History Narrative   Not on file   Social Drivers of Health   Financial Resource Strain: Not on file  Food Insecurity: Not on file  Transportation Needs: Not on file  Physical Activity: Not on file  Stress: Not on file   Social Connections: Not on file    Today's Vitals   07/12/24 0941  BP: 114/70  Pulse: 87  Resp: 16  Temp: 97.9 F (36.6 C)  SpO2: 97%  Weight: 255 lb (115.7 kg)  Height: 6' 1 (1.854 m)  Body mass index is 33.64 kg/m.  Physical Exam Vitals and nursing note reviewed.  Constitutional:      General: He is not in acute distress.    Appearance: He is well-developed.  HENT:     Head: Normocephalic and atraumatic.  Eyes:     Conjunctiva/sclera: Conjunctivae normal.  Cardiovascular:     Rate and Rhythm: Normal rate and regular rhythm.     Pulses:          Dorsalis pedis pulses are 2+ on the right side and 2+ on the left side.     Heart sounds: No murmur heard. Pulmonary:     Effort: Pulmonary effort is normal. No respiratory distress.     Breath sounds: Normal breath sounds.  Abdominal:     Palpations: Abdomen is soft. There is no mass.     Tenderness: There is no abdominal tenderness.  Lymphadenopathy:     Cervical: No cervical adenopathy.  Skin:    General: Skin is warm.     Findings: No erythema or rash.     Comments: Hyperpigmented macular, confluent rash with fine scaly area on back and chest. Not tender or erythematous.  Neurological:     Mental Status: He is alert and oriented to person, place, and time.     Cranial Nerves: No cranial nerve deficit.     Gait: Gait normal.  Psychiatric:        Mood and Affect: Mood and affect normal.   ASSESSMENT AND PLAN:  [name] was seen today for medical management of chronic issues.  Diagnoses and all orders for this visit:  Orders Placed This Encounter  Procedures   Lipid panel   ALT   Microalbumin / creatinine urine ratio   Lab Results  Component Value Date   LABMICR 10.7 11/05/2019   LABMICR 8.3 06/16/2018   MICROALBUR 1.6 07/12/2024   MICROALBUR 7.9 (H) 03/09/2024   Lab Results  Component Value Date   CHOL 145 07/12/2024   HDL 30.90 (L) 07/12/2024   LDLCALC 84 07/12/2024   LDLDIRECT 49.0  06/10/2022   TRIG 154.0 (H) 07/12/2024   CHOLHDL 5 07/12/2024   Type 2 diabetes mellitus with hyperglycemia, without long-term current use of insulin (HCC) Assessment & Plan: Last hemoglobin A1c 7.3 in 04/2024. He is reporting adequate home BS. Continue Ozempic  0.5 mg weekly and metformin  500 mg 2 tablets twice daily. Depending of urine microalbumin/creatinine ratio, will recommend starting Farxiga . Annual eye exam, periodic dental and foot care recommended. F/U in 2 months.  Orders: -  Microalbumin / creatinine urine ratio  Hyperlipidemia, unspecified hyperlipidemia type Assessment & Plan: Continue rosuvastatin  10 mg daily and low-fat diet. Further recommendation will be given according to lipid panel result.  Orders: -     Lipid panel -     ALT  Tinea versicolor We discussed diagnosis, prognosis, and treatment options. Recommend fluconazole  300 mg weekly x 2 and topical ketoconazole  shampoo. Explained that hyperpigmentation changes may persist after problem he has been treated. Follow-up as needed.  -     Fluconazole ; Take 2 tablets (300 mg total) by mouth once a week for 2 doses.  Dispense: 4 tablet; Refill: 0 -     Ketoconazole ; Apply 1 Application topically 2 (two) times a week. On affected area.  Dispense: 120 mL; Refill: 2  Return in about 9 weeks (around 09/13/2024) for chronic problems.  Monik Lins [place], MD [facility]. [place] office.

## 2024-07-12 NOTE — Assessment & Plan Note (Signed)
Continue rosuvastatin 10 mg daily and low-fat diet. Further recommendation will be given according to lipid panel result. 

## 2024-07-12 NOTE — Patient Instructions (Addendum)
 A few things to remember from today's visit:  Type 2 diabetes mellitus with hyperglycemia, without long-term current use of insulin (HCC) - Plan: Microalbumin / creatinine urine ratio  Hyperlipidemia, unspecified hyperlipidemia type - Plan: Lipid panel, ALT  Tinea versicolor - Plan: fluconazole  (DIFLUCAN ) 150 MG tablet, ketoconazole  (NIZORAL ) 2 % shampoo  Shampoo from neck down daily for a week then 2 times per week. 2 tabs of fluconazole  once per week for 2 weeks.  If you need refills for medications you take chronically, please call your pharmacy. Do not use My Chart to request refills or for acute issues that need immediate attention. If you send a my chart message, it may take a few days to be addressed, specially if I am not in the office.  Please be sure medication list is accurate. If a new problem present, please set up appointment sooner than planned today.

## 2024-07-12 NOTE — Assessment & Plan Note (Signed)
 Last hemoglobin A1c 7.3 in 04/2024. He is reporting adequate home BS. Continue Ozempic  0.5 mg weekly and metformin  500 mg 2 tablets twice daily. Depending of urine microalbumin/creatinine ratio, will recommend starting Farxiga . Annual eye exam, periodic dental and foot care recommended. F/U in 2 months.

## 2024-07-18 ENCOUNTER — Other Ambulatory Visit: Payer: Self-pay | Admitting: Family Medicine

## 2024-07-18 DIAGNOSIS — E1165 Type 2 diabetes mellitus with hyperglycemia: Secondary | ICD-10-CM

## 2024-09-12 ENCOUNTER — Other Ambulatory Visit: Payer: Self-pay | Admitting: Family Medicine

## 2024-09-12 DIAGNOSIS — E1165 Type 2 diabetes mellitus with hyperglycemia: Secondary | ICD-10-CM

## 2024-09-13 ENCOUNTER — Ambulatory Visit: Admitting: Family Medicine

## 2024-09-14 ENCOUNTER — Ambulatory Visit: Admitting: Family Medicine

## 2024-09-22 ENCOUNTER — Ambulatory Visit: Admitting: Family Medicine

## 2024-09-22 DIAGNOSIS — E785 Hyperlipidemia, unspecified: Secondary | ICD-10-CM

## 2024-09-22 DIAGNOSIS — E6609 Other obesity due to excess calories: Secondary | ICD-10-CM

## 2024-09-22 DIAGNOSIS — E1165 Type 2 diabetes mellitus with hyperglycemia: Secondary | ICD-10-CM

## 2024-09-27 ENCOUNTER — Telehealth: Payer: Self-pay | Admitting: Family Medicine

## 2024-09-27 NOTE — Telephone Encounter (Signed)
 Patient has late canceled/no showed 3 appointments since 10/20/23 which qualifies for dismissal. Please advise, thx

## 2024-09-28 NOTE — Telephone Encounter (Signed)
 Has appt 10/08/24, if he does not show, pt can be dismissed. BJ

## 2024-10-05 ENCOUNTER — Ambulatory Visit: Payer: Self-pay | Admitting: Family Medicine

## 2024-10-05 ENCOUNTER — Encounter: Attending: Family Medicine | Admitting: Registered"

## 2024-10-05 ENCOUNTER — Encounter: Payer: Self-pay | Admitting: Family Medicine

## 2024-10-05 ENCOUNTER — Ambulatory Visit (INDEPENDENT_AMBULATORY_CARE_PROVIDER_SITE_OTHER): Admitting: Family Medicine

## 2024-10-05 ENCOUNTER — Encounter: Payer: Self-pay | Admitting: Registered"

## 2024-10-05 VITALS — BP 118/70 | HR 79 | Temp 98.9°F | Resp 16 | Ht 73.0 in | Wt 260.4 lb

## 2024-10-05 DIAGNOSIS — Z7985 Long-term (current) use of injectable non-insulin antidiabetic drugs: Secondary | ICD-10-CM | POA: Diagnosis not present

## 2024-10-05 DIAGNOSIS — Z1211 Encounter for screening for malignant neoplasm of colon: Secondary | ICD-10-CM

## 2024-10-05 DIAGNOSIS — E1165 Type 2 diabetes mellitus with hyperglycemia: Secondary | ICD-10-CM

## 2024-10-05 DIAGNOSIS — Z23 Encounter for immunization: Secondary | ICD-10-CM | POA: Diagnosis not present

## 2024-10-05 DIAGNOSIS — Z7984 Long term (current) use of oral hypoglycemic drugs: Secondary | ICD-10-CM | POA: Diagnosis not present

## 2024-10-05 DIAGNOSIS — E66811 Obesity, class 1: Secondary | ICD-10-CM | POA: Diagnosis not present

## 2024-10-05 DIAGNOSIS — Z6833 Body mass index (BMI) 33.0-33.9, adult: Secondary | ICD-10-CM | POA: Diagnosis not present

## 2024-10-05 DIAGNOSIS — E119 Type 2 diabetes mellitus without complications: Secondary | ICD-10-CM | POA: Diagnosis present

## 2024-10-05 DIAGNOSIS — E785 Hyperlipidemia, unspecified: Secondary | ICD-10-CM

## 2024-10-05 DIAGNOSIS — E6609 Other obesity due to excess calories: Secondary | ICD-10-CM | POA: Diagnosis not present

## 2024-10-05 LAB — BASIC METABOLIC PANEL WITH GFR
BUN: 18 mg/dL (ref 6–23)
CO2: 31 meq/L (ref 19–32)
Calcium: 9.9 mg/dL (ref 8.4–10.5)
Chloride: 101 meq/L (ref 96–112)
Creatinine, Ser: 0.86 mg/dL (ref 0.40–1.50)
GFR: 103.17 mL/min
Glucose, Bld: 165 mg/dL — ABNORMAL HIGH (ref 70–99)
Potassium: 4.7 meq/L (ref 3.5–5.1)
Sodium: 139 meq/L (ref 135–145)

## 2024-10-05 LAB — LIPID PANEL
Cholesterol: 105 mg/dL (ref 28–200)
HDL: 21.2 mg/dL — ABNORMAL LOW
LDL Cholesterol: 6 mg/dL — ABNORMAL LOW (ref 10–99)
NonHDL: 84.2
Total CHOL/HDL Ratio: 5
Triglycerides: 393 mg/dL — ABNORMAL HIGH (ref 10.0–149.0)
VLDL: 78.6 mg/dL — ABNORMAL HIGH (ref 0.0–40.0)

## 2024-10-05 LAB — POCT GLYCOSYLATED HEMOGLOBIN (HGB A1C): Hemoglobin A1C: 7.7 % — AB (ref 4.0–5.6)

## 2024-10-05 MED ORDER — SEMAGLUTIDE (1 MG/DOSE) 4 MG/3ML ~~LOC~~ SOPN: 1.0000 mg | PEN_INJECTOR | SUBCUTANEOUS | 2 refills | Status: AC

## 2024-10-05 MED ORDER — ROSUVASTATIN CALCIUM 20 MG PO TABS: 20.0000 mg | ORAL_TABLET | Freq: Every day | ORAL | 3 refills | Status: AC

## 2024-10-05 MED ORDER — METFORMIN HCL ER 500 MG PO TB24: 1000.0000 mg | ORAL_TABLET | Freq: Every day | ORAL | 2 refills | Status: AC

## 2024-10-05 NOTE — Assessment & Plan Note (Addendum)
 Problem is not well-controlled, A1c went from 7.3 to 7.8 today. Recommend increasing dose of Ozempic  from 0.5 mg weekly to 1 mg weekly. Continue metformin  ER 500 mg 2 tablets daily, instructed to take it with supper. Continue monitoring BS regularly. Appointment with nutritionist will be arranged. Annual eye exam, periodic dental and foot care recommended. F/U in 4 months.

## 2024-10-05 NOTE — Telephone Encounter (Signed)
 Patient was informed of his lab results. Patient voiced understanding and agreed to taking the increased medication. Patient did not have any further questions or concerns.

## 2024-10-05 NOTE — Patient Instructions (Signed)
-   Aim to balance meals with 1/2 plate of non-starchy vegetables + 1/4 plate of of carbohydrates + 1/4 plate of protein for lunch and dinner.   - Balance breakfast with protein.

## 2024-10-05 NOTE — Progress Notes (Signed)
 "     Chief Complaint  Patient presents with   Medical Management of Chronic Issues   Discussed the use of AI scribe software for clinical note transcription with the patient, who gave verbal consent to proceed.  History of Present Illness James Hudson is a 49 year old male with past medical history significant for DM 2, hyperlipidemia, and obesity who is here today for chronic disease management.  He was last seen on 07/12/2024. No new problems since his last visit.  Diabetes Mellitus II: Dx'ed in 2016 glucose 335. Currently he is on Ozempic  0.5 mg weekly and metformin  ER at 500 mg 2 tabs daily at bedtime. He ran out of Ozempic  two weeks ago. His blood sugars have been running between 120 and 130 mg/dL, but he sometimes forgets to take his medication on time. No side effects from medications. 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  He does not exercise regularly. He has an upcoming appointment with his eye care provider, Dr. Veneda.  Lab Results  Component Value Date   HGBA1C 7.3 (H) 05/17/2024   Lab Results  Component Value Date   LABMICR 10.7 11/05/2019   LABMICR 8.3 06/16/2018   MICROALBUR 1.6 07/12/2024   MICROALBUR 7.9 (H) 03/09/2024   Lab Results  Component Value Date   CREATININE 0.80 03/09/2024   BUN 16 03/09/2024   NA 134 (L) 03/09/2024   K 4.1 03/09/2024   CL 102 03/09/2024   CO2 13 (L) 03/09/2024   Hyperlipidemia: He is currently on rosuvastatin  10 mg daily.  He has been more consistent with taking medication, although he admits to forgetting to take it about once per week. Has not been consistent with following low-fat diet.  Lab Results  Component Value Date   CHOL 145 07/12/2024   HDL 30.90 (L) 07/12/2024   LDLCALC 84 07/12/2024   LDLDIRECT 49.0  06/10/2022   TRIG 154.0 (H) 07/12/2024   CHOLHDL 5 07/12/2024   Review of Systems  Constitutional:  Negative for activity change, appetite change, chills and fever.  HENT:  Negative for mouth sores and sore throat.   Respiratory:  Negative for cough, shortness of breath and wheezing.   Cardiovascular:  Negative for chest pain, palpitations and leg swelling.  Gastrointestinal:  Negative for abdominal pain, nausea and vomiting.  Genitourinary:  Negative for decreased urine volume, dysuria and hematuria.  Musculoskeletal:  Negative for myalgias.  Skin:  Negative for pallor and wound.  Neurological:  Negative for syncope, weakness and headaches.  Psychiatric/Behavioral:  Negative for confusion and hallucinations.   See other pertinent positives and negatives in HPI.  Current Outpatient Medications on File Prior to Visit  Medication Sig Dispense Refill   rosuvastatin  (CRESTOR ) 10 MG tablet Take 1 tablet (10 mg total) by mouth daily. 90 tablet 3   No current facility-administered medications on file prior to visit.   Past Medical History:  Diagnosis Date   Diabetes mellitus without complication (HCC)    Hyperlipidemia    No Known Allergies  Social History   Socioeconomic History   Marital status: Married    Spouse name: Not on file   Number of children: Not on file   Years of education: Not on file   Highest education level: Not on file  Occupational History   Not on file  Tobacco Use  Smoking status: Never   Smokeless tobacco: Never  Substance and Sexual Activity   Alcohol use: No   Drug use: No   Sexual activity: Not on file  Other Topics Concern   Not on file  Social History Narrative   Not on file   Social Drivers of Health   Tobacco Use: Low Risk (10/05/2024)   Patient History    Smoking Tobacco Use: Never    Smokeless Tobacco Use: Never    Passive Exposure: Not on file  Financial Resource Strain: Not on file  Food Insecurity: Not on file  Transportation  Needs: Not on file  Physical Activity: Not on file  Stress: Not on file  Social Connections: Not on file  Depression (PHQ2-9): Low Risk (03/09/2024)   Depression (PHQ2-9)    PHQ-2 Score: 0  Alcohol Screen: Not on file  Housing: Not on file  Utilities: Not on file  Health Literacy: Not on file   Today's Vitals   10/05/24 0808  BP: 118/70  Pulse: 79  Resp: 16  Temp: 98.9 F (37.2 C)  SpO2: 95%  Weight: 260 lb 6.4 oz (118.1 kg)  Height: 6' 1 (1.854 m)   Wt Readings from Last 3 Encounters:  10/05/24 260 lb 6.4 oz (118.1 kg)  07/12/24 255 lb (115.7 kg)  05/28/24 248 lb (112.5 kg)   Body mass index is 34.36 kg/m.  Physical Exam Vitals and nursing note reviewed.  Constitutional:      General: He is not in acute distress.    Appearance: He is well-developed.  HENT:     Head: Normocephalic and atraumatic.     Mouth/Throat:     Mouth: Mucous membranes are moist.     Pharynx: Oropharynx is clear.  Eyes:     Conjunctiva/sclera: Conjunctivae normal.  Cardiovascular:     Rate and Rhythm: Normal rate and regular rhythm.     Pulses:          Dorsalis pedis pulses are 2+ on the right side and 2+ on the left side.     Heart sounds: No murmur heard. Pulmonary:     Effort: Pulmonary effort is normal. No respiratory distress.     Breath sounds: Normal breath sounds.  Abdominal:     Palpations: Abdomen is soft. There is no mass.     Tenderness: There is no abdominal tenderness.  Lymphadenopathy:     Cervical: No cervical adenopathy.  Skin:    General: Skin is warm.     Findings: No erythema or rash.  Neurological:     Mental Status: He is alert and oriented to person, place, and time.     Gait: Gait normal.  Psychiatric:        Mood and Affect: Mood and affect normal.   ASSESSMENT AND PLAN:  [name] was seen today for medical management of chronic issues.  Diagnoses and all orders for this visit:  Orders Placed This Encounter  Procedures   Cologuard    Basic metabolic panel with GFR   Lipid panel   Amb Referral to Nutrition and Diabetic Education   POC HgB A1c   Lab Results  Component Value Date   CHOL 105 10/05/2024   HDL 21.20 (L) 10/05/2024   LDLCALC 6 (L) 10/05/2024   LDLDIRECT 49.0 06/10/2022   TRIG 393.0 (H) 10/05/2024   CHOLHDL 5 10/05/2024   Lab Results  Component Value Date   NA 139 10/05/2024   CL 101 10/05/2024   K 4.7  10/05/2024   CO2 31 10/05/2024   BUN 18 10/05/2024   CREATININE 0.86 10/05/2024   GFR 103.17 10/05/2024   CALCIUM  9.9 10/05/2024   ALBUMIN 4.1 03/09/2024   GLUCOSE 165 (H) 10/05/2024   Lab Results  Component Value Date   HGBA1C 7.7 (A) 10/05/2024   Type 2 diabetes mellitus with hyperglycemia, without long-term current use of insulin (HCC) Assessment & Plan: Problem is not well-controlled, A1c went from 7.3 to 7.7 today. Recommend increasing dose of Ozempic  from 0.5 mg weekly to 1 mg weekly. Continue metformin  ER 500 mg 2 tablets daily, instructed to take it with supper. Continue monitoring BS regularly. Appointment with nutritionist will be arranged. Annual eye exam, periodic dental and foot care recommended. F/U in 4 months.  Orders: -     POCT glycosylated hemoglobin (Hb A1C) -     Amb Referral to Nutrition and Diabetic Education -     metFORMIN  HCl ER; Take 2 tablets (1,000 mg total) by mouth daily with supper.  Dispense: 180 tablet; Refill: 2 -     Semaglutide  (1 MG/DOSE); Inject 1 mg as directed once a week.  Dispense: 9 mL; Refill: 2 -     Basic metabolic panel with GFR; Future -     Lipid panel; Future  Hyperlipidemia, unspecified hyperlipidemia type Assessment & Plan: Yesterday importance of being compliant with pharmacologic treatment. Currently on rosuvastatin  10 mg, forgets to take it about once per week. Low-fat diet also recommended. Last LDL was 84 in 06/2024. Further recommendation will be given according to lipid panel result.  Class 1 obesity due to excess calories  with serious comorbidity and body mass index (BMI) of 33.0 to 33.9 in adult Assessment & Plan: Steadily gaining wt since 04/2024. Consistency with healthy diet and physical activity encouraged. Ozempic , prescribed for diabetes management,may help, dose increased today.  Colon cancer screening -     Cologuard  Immunization due -     Tdap vaccine greater than or equal to 7yo IM  Return in about 4 months (around 02/02/2025).  Janai Maudlin, MD [facility]. [place] office.    "

## 2024-10-05 NOTE — Progress Notes (Signed)
 Diabetes Self-Management Education  Visit Type: First/Initial  Appt. Start Time: 10:20 Appt. End Time: 10:59  10/13/2024  [name], identified by name and date of birth, is a 49 y.o. male with a diagnosis of Diabetes: Type 2.   ASSESSMENT  States he is waiting for [facility] to restock Ozempic  and then he can resume taking it. States work is starting to get more demanding. States he is not sleeping as much as he used to. States he needs a meal regimen to follow. States he does the grocery shopping and cooking in his family.   Lives with wife and daughter (age 32).    There were no vitals taken for this visit. There is no height or weight on file to calculate BMI.   Diabetes Self-Management Education - 10/05/24 1024       Visit Information   Visit Type First/Initial      Initial Visit   Diabetes Type Type 2    Date Diagnosed 2022    Are you taking your medications as prescribed? Yes      Health Coping   How would you rate your overall health? Good      Psychosocial Assessment   Self-care barriers None    Self-management support Family    Other persons present Patient    Patient Concerns Nutrition/Meal planning    Special Needs None    Preferred Learning Style No preference indicated    Learning Readiness Contemplating    How often do you need to have someone help you when you read instructions, pamphlets, or other written materials from your doctor or pharmacy? 1 - Never      Complications   Last HgB A1C per patient/outside source 7.7 %    How often do you check your blood sugar? 1-2 times/day    Postprandial Blood glucose range (mg/dL) 29-870;[phone]    Number of hypoglycemic episodes per month 0    Number of hyperglycemic episodes ( >200mg /dL): Rare    Can you tell when your blood sugar is high? Yes    What do you do if your blood sugar is high? drink a lot of water    Have you had a dilated eye exam in the past 12 months? Yes    Have you had a dental exam in  the past 12 months? Yes    Are you checking your feet? Yes    How many days per week are you checking your feet? 7      Dietary Intake   Breakfast 6 am-cereal    Lunch 12 pm-[employer] (with chicken, shrimp, sausage)    Dinner 7:30 pm-sub (ham, turkey, veggies, cheese, mayo) + zero sugar sweet tea + chips    Beverage(s) zer sugar sewet tea      Activity / Exercise   Activity / Exercise Type Light (walking / raking leaves)    How many days per week do you exercise? 1    How many minutes per day do you exercise? 45    Total minutes per week of exercise 45      Patient Education   Previous Diabetes Education No      Individualized Goals (developed by patient)   Nutrition Follow meal plan discussed;General guidelines for healthy choices and portions discussed    Medications take my medication as prescribed      Post-Education Assessment   Patient understands the diabetes disease and treatment process. Needs Review    Patient understands incorporating nutritional management  into lifestyle. Comprehends key points    Patient undertands incorporating physical activity into lifestyle. Needs Review    Patient understands using medications safely. Needs Review    Patient understands monitoring blood glucose, interpreting and using results Needs Review    Patient understands prevention, detection, and treatment of acute complications. Needs Review    Patient understands prevention, detection, and treatment of chronic complications. Needs Review    Patient understands how to develop strategies to address psychosocial issues. Needs Review    Patient understands how to develop strategies to promote health/change behavior. Needs Review      Outcomes   Expected Outcomes Demonstrated interest in learning. Expect positive outcomes    Future DMSE 4-6 wks    Program Status Not Completed          Individualized Plan for Diabetes Self-Management Training:   Learning Objective:  Patient  will have a greater understanding of diabetes self-management. Patient education plan is to attend individual and/or group sessions per assessed needs and concerns.   Plan:   Patient Instructions  - Aim to balance meals with 1/2 plate of non-starchy vegetables + 1/4 plate of of carbohydrates + 1/4 plate of protein for lunch and dinner.   - Balance breakfast with protein.   Expected Outcomes:  Demonstrated interest in learning. Expect positive outcomes  Education material provided: ADA - How to Thrive: A Guide for Your Journey with Diabetes  If problems or questions, patient to contact team via:  Phone and Email  Future DSME appointment: 4-6 wks

## 2024-10-05 NOTE — Patient Instructions (Addendum)
 A few things to remember from today's visit:  Type 2 diabetes mellitus with hyperglycemia, without long-term current use of insulin (HCC) - Plan: POC HgB A1c, Amb Referral to Nutrition and Diabetic Education, metFORMIN  (GLUCOPHAGE -XR) 500 MG 24 hr tablet, Semaglutide , 1 MG/DOSE, 4 MG/3ML SOPN, Basic metabolic panel with GFR, Lipid panel  Hyperlipidemia, unspecified hyperlipidemia type  Colon cancer screening - Plan: Cologuard Take metformin  with supper. Increase ozempic  to 1 mg weekly.  If you need refills for medications you take chronically, please call your pharmacy. Do not use My Chart to request refills or for acute issues that need immediate attention. If you send a my chart message, it may take a few days to be addressed, specially if I am not in the office.  Please be sure medication list is accurate. If a new problem present, please set up appointment sooner than planned today.

## 2024-10-05 NOTE — Assessment & Plan Note (Signed)
 Yesterday importance of being compliant with pharmacologic treatment. Currently on rosuvastatin  10 mg, forgets to take it about once per week. Low-fat diet also recommended. Last LDL was 84 in 06/2024. Further recommendation will be given according to lipid panel result.

## 2024-10-05 NOTE — Assessment & Plan Note (Signed)
 Steadily gaining wt since 04/2024. Consistency with healthy diet and physical activity encouraged.

## 2024-10-08 ENCOUNTER — Ambulatory Visit: Admitting: Family Medicine

## 2024-10-12 NOTE — Telephone Encounter (Signed)
 Pt canceled 10/08/24 appointment in a timely manner.

## 2024-11-09 ENCOUNTER — Encounter: Admitting: Registered"

## 2025-02-07 ENCOUNTER — Ambulatory Visit: Admitting: Family Medicine
